# Patient Record
Sex: Male | Born: 1950 | Race: White | Hispanic: No | Smoking: Current every day smoker
Health system: Southern US, Community
[De-identification: ages and names within clinical notes are randomized; demographics above are authoritative.]

## PROBLEM LIST (undated history)

## (undated) DIAGNOSIS — I10 Essential (primary) hypertension: Secondary | ICD-10-CM

## (undated) DIAGNOSIS — J811 Chronic pulmonary edema: Secondary | ICD-10-CM

## (undated) DIAGNOSIS — J449 Chronic obstructive pulmonary disease, unspecified: Secondary | ICD-10-CM

## (undated) DIAGNOSIS — I428 Other cardiomyopathies: Secondary | ICD-10-CM

## (undated) DIAGNOSIS — E119 Type 2 diabetes mellitus without complications: Secondary | ICD-10-CM

## (undated) DIAGNOSIS — I219 Acute myocardial infarction, unspecified: Secondary | ICD-10-CM

## (undated) DIAGNOSIS — J45909 Unspecified asthma, uncomplicated: Secondary | ICD-10-CM

## (undated) DIAGNOSIS — J9690 Respiratory failure, unspecified, unspecified whether with hypoxia or hypercapnia: Secondary | ICD-10-CM

## (undated) HISTORY — PX: APPENDECTOMY: SHX54

## (undated) HISTORY — DX: Type 2 diabetes mellitus without complications: E11.9

## (undated) HISTORY — PX: BACK SURGERY: SHX140

## (undated) HISTORY — DX: Essential (primary) hypertension: I10

## (undated) HISTORY — DX: Other cardiomyopathies: I42.8

---

## 2001-05-07 ENCOUNTER — Ambulatory Visit (HOSPITAL_COMMUNITY): Admission: RE | Admit: 2001-05-07 | Discharge: 2001-05-07 | Payer: Self-pay | Admitting: Family Medicine

## 2001-05-07 ENCOUNTER — Encounter: Payer: Self-pay | Admitting: Family Medicine

## 2003-01-18 ENCOUNTER — Ambulatory Visit (HOSPITAL_COMMUNITY): Admission: RE | Admit: 2003-01-18 | Discharge: 2003-01-19 | Payer: Self-pay | Admitting: Neurosurgery

## 2003-02-14 ENCOUNTER — Encounter (HOSPITAL_COMMUNITY): Admission: RE | Admit: 2003-02-14 | Discharge: 2003-03-16 | Payer: Self-pay | Admitting: Neurosurgery

## 2003-06-03 ENCOUNTER — Emergency Department (HOSPITAL_COMMUNITY): Admission: EM | Admit: 2003-06-03 | Discharge: 2003-06-03 | Payer: Self-pay | Admitting: Family Medicine

## 2003-06-14 ENCOUNTER — Encounter: Admission: RE | Admit: 2003-06-14 | Discharge: 2003-06-14 | Payer: Self-pay | Admitting: Neurosurgery

## 2003-06-26 ENCOUNTER — Encounter: Admission: RE | Admit: 2003-06-26 | Discharge: 2003-06-26 | Payer: Self-pay | Admitting: Neurosurgery

## 2003-07-19 ENCOUNTER — Encounter: Admission: RE | Admit: 2003-07-19 | Discharge: 2003-07-19 | Payer: Self-pay | Admitting: Neurosurgery

## 2003-08-26 ENCOUNTER — Ambulatory Visit (HOSPITAL_COMMUNITY): Admission: RE | Admit: 2003-08-26 | Discharge: 2003-08-26 | Payer: Self-pay | Admitting: Emergency Medicine

## 2004-07-18 ENCOUNTER — Encounter: Admission: RE | Admit: 2004-07-18 | Discharge: 2004-07-18 | Payer: Self-pay | Admitting: Neurosurgery

## 2006-08-14 ENCOUNTER — Emergency Department (HOSPITAL_COMMUNITY): Admission: EM | Admit: 2006-08-14 | Discharge: 2006-08-14 | Payer: Self-pay | Admitting: Emergency Medicine

## 2006-11-17 ENCOUNTER — Ambulatory Visit: Payer: Self-pay | Admitting: Cardiology

## 2010-06-28 NOTE — Op Note (Signed)
NAME:  Cody [name], Cody [name]                             ACCOUNT NO.:  [id number]   MEDICAL RECORD NO.:  [id number]                   PATIENT TYPE:  OIB   LOCATION:  NA                                   FACILITY:  [facility]   PHYSICIAN:  Hewitt Shorts, M.D.            DATE OF BIRTH:  06-05-1950   DATE OF PROCEDURE:  01/18/2003  DATE OF DISCHARGE:                                 OPERATIVE REPORT   PREOPERATIVE DIAGNOSIS:  Right L2-3 foraminal disk herniation, degenerative  disk disease, spondylosis and radiculopathy.   POSTOPERATIVE DIAGNOSIS:  Right L2-3 foraminal disk herniation, degenerative  disk disease, spondylosis and radiculopathy.   OPERATION PERFORMED:  Right L2-3 extraforaminal microdiskectomy with  microdissection.   SURGEON:  Hewitt Shorts, M.D.   ANESTHESIA:  General endotracheal.   INDICATIONS FOR PROCEDURE:  The patient is a 60 year old man who presented  with an acute right lumbar radiculopathy with iliopsoas weakness.  He was  found to have a right L2-3 foraminal disk herniation.  Decision was made to  proceed with decompression via diskectomy.   DESCRIPTION OF PROCEDURE:  The patient was brought to the operating room and  placed under general endotracheal anesthesia.  The patient was turned to a  prone position.  The lumbar region was prepped with DuraPrep and draped in  sterile fashion.  The midline was infiltrated with local anesthetic with  epinephrine.  An x-ray was taken and the L2-3 level identified and a midline  incision made over the L2-3 level and carried down through the subcutaneous  tissue.  Bipolar cautery and electrocautery used to maintain hemostasis.  Dissection was carried down to the lumbar fascia which was incised to the  right side of midline and paraspinal muscles were dissected from the spinous  process and lamina in subperiosteal fashion.  Dissection was carried out  over the right L2-3 facet complex and the extraforaminal space was  carefully  exposed.  The operating microscope was draped and brought into the field to  provide additional magnification, illumination and visualization and the  remainder of the decompression and diskectomy was performed using  microdissection and microsurgical technique.  A lateral facetectomy was  performed using the [facility] Max drill as well as Kerrison punches and we  dissected in the extraforaminal space down to the right L2-3 foramen.  We  identified the right L2 nerve root and we were able to identify the disk  herniation.  We were able to mobilize it from the surrounding tissues and  removed a large free fragment of disk herniation that was compressing the  nerve root.  The nerve root relaxed and felt back into place after the disk  herniation had been removed.  We examined the foramen and extraforaminal  space for additional fragments of disk.  None were found and it was not felt  necessary to enter into the disk space.  Since the nerve  root was well  decompressed and all fragments removed.  We then irrigated the wound with  bacitracin solution, checked for hemostasis which was established with the  use of bipolar cautery as well as Gelfoam soaked in Thrombin.  All the  Gelfoam, though, was removed prior to closure.  We then instilled 2 mL of  fentanyl and 80 mg of Depo-Medrol into the extraforaminal space and then  proceeded with closure.  The deep fascia was closed with interrupted 1  undyed Vicryl sutures, the subcutaneous and subcuticular layer were closed  with interrupted inverted 2-0 undyed Vicryl sutures and the skin  edges were approximated with Dermabond.  The patient tolerated the procedure  well.  The estimated blood loss was 25 mL.  Sponge and instrument counts  were correct.  Following surgery, the patient was turned back to supine  position, reversed from anesthetic to be extubated and transferred to  recovery room for further care.                                                Hewitt Shorts, M.D.    RWN/MEDQ  D:  01/18/2003  T:  01/19/2003  Job:  [id number]

## 2010-11-26 LAB — URINALYSIS, ROUTINE W REFLEX MICROSCOPIC
Glucose, UA: 250 — AB
Ketones, ur: NEGATIVE
Leukocytes, UA: NEGATIVE
Nitrite: NEGATIVE
pH: 5.5

## 2010-11-26 LAB — URINE MICROSCOPIC-ADD ON

## 2012-01-01 ENCOUNTER — Encounter (HOSPITAL_COMMUNITY)
Admission: EM | Disposition: A | Discharge status: Home / Self Care | Payer: Self-pay | Source: Home / Self Care | Attending: Emergency Medicine

## 2012-01-01 ENCOUNTER — Encounter (HOSPITAL_COMMUNITY): Payer: Self-pay | Admitting: Anesthesiology

## 2012-01-01 ENCOUNTER — Emergency Department (HOSPITAL_COMMUNITY): Payer: Self-pay

## 2012-01-01 ENCOUNTER — Encounter (HOSPITAL_COMMUNITY): Payer: Self-pay | Admitting: *Deleted

## 2012-01-01 ENCOUNTER — Ambulatory Visit (HOSPITAL_COMMUNITY)
Admission: EM | Admit: 2012-01-01 | Discharge: 2012-01-01 | Disposition: A | Discharge status: Home / Self Care | Payer: Self-pay | Attending: Emergency Medicine | Admitting: Emergency Medicine

## 2012-01-01 ENCOUNTER — Emergency Department (HOSPITAL_COMMUNITY): Payer: Self-pay | Admitting: Anesthesiology

## 2012-01-01 DIAGNOSIS — S6991XA Unspecified injury of right wrist, hand and finger(s), initial encounter: Secondary | ICD-10-CM

## 2012-01-01 DIAGNOSIS — IMO0002 Reserved for concepts with insufficient information to code with codable children: Secondary | ICD-10-CM | POA: Insufficient documentation

## 2012-01-01 DIAGNOSIS — S61209A Unspecified open wound of unspecified finger without damage to nail, initial encounter: Secondary | ICD-10-CM | POA: Insufficient documentation

## 2012-01-01 DIAGNOSIS — F17200 Nicotine dependence, unspecified, uncomplicated: Secondary | ICD-10-CM | POA: Insufficient documentation

## 2012-01-01 DIAGNOSIS — S68119A Complete traumatic metacarpophalangeal amputation of unspecified finger, initial encounter: Secondary | ICD-10-CM

## 2012-01-01 DIAGNOSIS — E119 Type 2 diabetes mellitus without complications: Secondary | ICD-10-CM | POA: Insufficient documentation

## 2012-01-01 DIAGNOSIS — I10 Essential (primary) hypertension: Secondary | ICD-10-CM | POA: Insufficient documentation

## 2012-01-01 DIAGNOSIS — W309XXA Contact with unspecified agricultural machinery, initial encounter: Secondary | ICD-10-CM | POA: Insufficient documentation

## 2012-01-01 HISTORY — PX: AMPUTATION: SHX166

## 2012-01-01 HISTORY — PX: CLOSED REDUCTION FINGER WITH PERCUTANEOUS PINNING: SHX5612

## 2012-01-01 LAB — CBC WITH DIFFERENTIAL/PLATELET
Eosinophils Absolute: 0.1 10*3/uL (ref 0.0–0.7)
Eosinophils Relative: 1 % (ref 0–5)
Hemoglobin: 16.4 g/dL (ref 13.0–17.0)
Lymphocytes Relative: 25 % (ref 12–46)
Lymphs Abs: 2.1 10*3/uL (ref 0.7–4.0)
Neutrophils Relative %: 68 % (ref 43–77)
RBC: 4.96 MIL/uL (ref 4.22–5.81)

## 2012-01-01 LAB — BASIC METABOLIC PANEL
BUN: 13 mg/dL (ref 6–23)
CO2: 25 mEq/L (ref 19–32)
Creatinine, Ser: 0.91 mg/dL (ref 0.50–1.35)
Glucose, Bld: 189 mg/dL — ABNORMAL HIGH (ref 70–99)
Potassium: 3.6 mEq/L (ref 3.5–5.1)

## 2012-01-01 LAB — GLUCOSE, CAPILLARY: Glucose-Capillary: 225 mg/dL — ABNORMAL HIGH (ref 70–99)

## 2012-01-01 SURGERY — AMPUTATION DIGIT
Anesthesia: General | Site: Finger | Laterality: Right | Wound class: Dirty or Infected

## 2012-01-01 MED ORDER — TETANUS-DIPHTH-ACELL PERTUSSIS 5-2.5-18.5 LF-MCG/0.5 IM SUSP
0.5000 mL | Freq: Once | INTRAMUSCULAR | Status: AC
  Administered 2012-01-01: 0.5 mL via INTRAMUSCULAR
  Filled 2012-01-01: qty 0.5

## 2012-01-01 MED ORDER — OXYCODONE HCL 5 MG/5ML PO SOLN: 5.0000 mg | Freq: Once | ORAL | Status: DC | PRN

## 2012-01-01 MED ORDER — FENTANYL CITRATE 0.05 MG/ML IJ SOLN
50.0000 ug | Freq: Once | INTRAMUSCULAR | Status: AC
  Administered 2012-01-01: 50 ug via INTRAVENOUS
  Filled 2012-01-01: qty 2

## 2012-01-01 MED ORDER — 0.9 % SODIUM CHLORIDE (POUR BTL) OPTIME
TOPICAL | Status: DC | PRN
  Administered 2012-01-01: 1000 mL

## 2012-01-01 MED ORDER — OXYCODONE HCL 5 MG PO TABS: 5.0000 mg | ORAL_TABLET | Freq: Once | ORAL | Status: DC | PRN

## 2012-01-01 MED ORDER — VANCOMYCIN HCL 1000 MG IV SOLR
1000.0000 mg | INTRAVENOUS | Status: DC | PRN
  Administered 2012-01-01: 1000 mg via INTRAVENOUS

## 2012-01-01 MED ORDER — MORPHINE SULFATE 4 MG/ML IJ SOLN
4.0000 mg | Freq: Once | INTRAMUSCULAR | Status: AC
  Administered 2012-01-01: 4 mg via INTRAVENOUS
  Filled 2012-01-01: qty 1

## 2012-01-01 MED ORDER — PROMETHAZINE HCL 25 MG/ML IJ SOLN: 6.2500 mg | INTRAMUSCULAR | Status: DC | PRN

## 2012-01-01 MED ORDER — EPHEDRINE SULFATE 50 MG/ML IJ SOLN
INTRAMUSCULAR | Status: DC | PRN
  Administered 2012-01-01 (×3): 5 mg via INTRAVENOUS

## 2012-01-01 MED ORDER — HYDROMORPHONE HCL PF 1 MG/ML IJ SOLN
0.5000 mg | Freq: Once | INTRAMUSCULAR | Status: AC
  Administered 2012-01-01: 0.5 mg via INTRAVENOUS
  Filled 2012-01-01: qty 1

## 2012-01-01 MED ORDER — OXYCODONE-ACETAMINOPHEN 5-325 MG PO TABS: ORAL_TABLET | ORAL | Status: DC

## 2012-01-01 MED ORDER — LIDOCAINE HCL 1 % IJ SOLN
INTRAMUSCULAR | Status: DC | PRN
  Administered 2012-01-01: 80 mg via INTRADERMAL

## 2012-01-01 MED ORDER — ONDANSETRON HCL 4 MG/2ML IJ SOLN
INTRAMUSCULAR | Status: DC | PRN
  Administered 2012-01-01: 4 mg via INTRAVENOUS

## 2012-01-01 MED ORDER — BUPIVACAINE HCL (PF) 0.25 % IJ SOLN
INTRAMUSCULAR | Status: DC | PRN
  Administered 2012-01-01: 18 mL

## 2012-01-01 MED ORDER — MIDAZOLAM HCL 5 MG/5ML IJ SOLN
INTRAMUSCULAR | Status: DC | PRN
  Administered 2012-01-01: 2 mg via INTRAVENOUS

## 2012-01-01 MED ORDER — SULFAMETHOXAZOLE-TRIMETHOPRIM 800-160 MG PO TABS: 1.0000 | ORAL_TABLET | Freq: Two times a day (BID) | ORAL | Status: DC

## 2012-01-01 MED ORDER — LACTATED RINGERS IV SOLN
INTRAVENOUS | Status: DC | PRN
  Administered 2012-01-01 (×2): via INTRAVENOUS

## 2012-01-01 MED ORDER — VANCOMYCIN HCL IN DEXTROSE 1-5 GM/200ML-% IV SOLN
INTRAVENOUS | Status: AC
  Filled 2012-01-01: qty 200

## 2012-01-01 MED ORDER — CLINDAMYCIN PHOSPHATE 600 MG/50ML IV SOLN
600.0000 mg | Freq: Once | INTRAVENOUS | Status: AC
  Administered 2012-01-01: 600 mg via INTRAVENOUS
  Filled 2012-01-01: qty 50

## 2012-01-01 MED ORDER — HYDROMORPHONE HCL PF 1 MG/ML IJ SOLN: 0.2500 mg | INTRAMUSCULAR | Status: DC | PRN

## 2012-01-01 MED ORDER — FENTANYL CITRATE 0.05 MG/ML IJ SOLN
INTRAMUSCULAR | Status: DC | PRN
  Administered 2012-01-01: 50 ug via INTRAVENOUS

## 2012-01-01 MED ORDER — BUPIVACAINE HCL (PF) 0.25 % IJ SOLN
INTRAMUSCULAR | Status: AC
  Filled 2012-01-01: qty 30

## 2012-01-01 MED ORDER — PROPOFOL 10 MG/ML IV BOLUS
INTRAVENOUS | Status: DC | PRN
  Administered 2012-01-01: 160 mg via INTRAVENOUS

## 2012-01-01 SURGICAL SUPPLY — 49 items
BANDAGE ELASTIC 3 VELCRO ST LF (GAUZE/BANDAGES/DRESSINGS) ×1 IMPLANT
BANDAGE GAUZE ELAST BULKY 4 IN (GAUZE/BANDAGES/DRESSINGS) ×1 IMPLANT
BLADE LONG MED 31X9 (MISCELLANEOUS) ×2 IMPLANT
BNDG CMPR 9X4 STRL LF SNTH (GAUZE/BANDAGES/DRESSINGS) ×1
BNDG COHESIVE 3X5 TAN STRL LF (GAUZE/BANDAGES/DRESSINGS) IMPLANT
BNDG ESMARK 4X9 LF (GAUZE/BANDAGES/DRESSINGS) ×2 IMPLANT
CAP PIN PROTECTOR ORTHO WHT (CAP) ×1 IMPLANT
CLOTH BEACON ORANGE TIMEOUT ST (SAFETY) ×2 IMPLANT
CORDS BIPOLAR (ELECTRODE) ×2 IMPLANT
CUFF TOURNIQUET SINGLE 18IN (TOURNIQUET CUFF) ×2 IMPLANT
CUFF TOURNIQUET SINGLE 24IN (TOURNIQUET CUFF) IMPLANT
DRAPE OEC MINIVIEW 54X84 (DRAPES) ×1 IMPLANT
DRSG KUZMA FLUFF (GAUZE/BANDAGES/DRESSINGS) IMPLANT
DURAPREP 26ML APPLICATOR (WOUND CARE) ×1 IMPLANT
GAUZE SPONGE 4X4 16PLY XRAY LF (GAUZE/BANDAGES/DRESSINGS) ×1 IMPLANT
GAUZE XEROFORM 1X8 LF (GAUZE/BANDAGES/DRESSINGS) ×2 IMPLANT
GAUZE XEROFORM 5X9 LF (GAUZE/BANDAGES/DRESSINGS) ×1 IMPLANT
GLOVE BIO SURGEON STRL SZ 6.5 (GLOVE) ×1 IMPLANT
GLOVE BIO SURGEON STRL SZ8.5 (GLOVE) ×1 IMPLANT
GLOVE SURG ORTHO 8.0 STRL STRW (GLOVE) ×2 IMPLANT
GOWN PREVENTION PLUS XLARGE (GOWN DISPOSABLE) ×3 IMPLANT
GOWN STRL NON-REIN LRG LVL3 (GOWN DISPOSABLE) ×4 IMPLANT
GOWN STRL REIN XL XLG (GOWN DISPOSABLE) ×2 IMPLANT
KIT BASIN OR (CUSTOM PROCEDURE TRAY) ×2 IMPLANT
KIT ROOM TURNOVER OR (KITS) ×2 IMPLANT
KWIRE 4.0 X .035IN (WIRE) ×1 IMPLANT
MANIFOLD NEPTUNE II (INSTRUMENTS) ×1 IMPLANT
NDL HYPO 25GX1X1/2 BEV (NEEDLE) IMPLANT
NEEDLE HYPO 25GX1X1/2 BEV (NEEDLE) ×2 IMPLANT
NS IRRIG 1000ML POUR BTL (IV SOLUTION) ×2 IMPLANT
PACK ORTHO EXTREMITY (CUSTOM PROCEDURE TRAY) ×2 IMPLANT
PAD ARMBOARD 7.5X6 YLW CONV (MISCELLANEOUS) ×4 IMPLANT
PAD CAST 4YDX4 CTTN HI CHSV (CAST SUPPLIES) IMPLANT
PADDING CAST ABS 4INX4YD NS (CAST SUPPLIES) ×1
PADDING CAST ABS COTTON 4X4 ST (CAST SUPPLIES) IMPLANT
PADDING CAST COTTON 4X4 STRL (CAST SUPPLIES)
RUBBERBAND STERILE (MISCELLANEOUS) ×1 IMPLANT
SPECIMEN JAR SMALL (MISCELLANEOUS) ×1 IMPLANT
SPONGE GAUZE 4X4 12PLY (GAUZE/BANDAGES/DRESSINGS) ×1 IMPLANT
SPONGE SCRUB IODOPHOR (GAUZE/BANDAGES/DRESSINGS) ×2 IMPLANT
SUT ETHILON 5 0 PS 2 18 (SUTURE) IMPLANT
SUT MON AB 5-0 P3 18 (SUTURE) ×4 IMPLANT
SUT SILK 4 0 PS 2 (SUTURE) IMPLANT
SUT VICRYL 4-0 PS2 18IN ABS (SUTURE) IMPLANT
SYR CONTROL 10ML LL (SYRINGE) ×1 IMPLANT
TOWEL OR 17X24 6PK STRL BLUE (TOWEL DISPOSABLE) ×2 IMPLANT
TOWEL OR 17X26 10 PK STRL BLUE (TOWEL DISPOSABLE) ×2 IMPLANT
UNDERPAD 30X30 INCONTINENT (UNDERPADS AND DIAPERS) ×2 IMPLANT
WATER STERILE IRR 1000ML POUR (IV SOLUTION) ×1 IMPLANT

## 2012-01-01 NOTE — Op Note (Signed)
Dictation [id number]

## 2012-01-01 NOTE — Transfer of Care (Signed)
Immediate Anesthesia Transfer of Care Note  Patient: Cody [name]  Procedure(s) Performed: Procedure(s) (LRB) with comments: AMPUTATION DIGIT (Right) - Right Ring and small finger revision of amputation, I&D and pinning CLOSED REDUCTION FINGER WITH PERCUTANEOUS PINNING (Right)  Patient Location: PACU  Anesthesia Type:General  Level of Consciousness: awake, alert  and oriented  Airway & Oxygen Therapy: Patient Spontanous Breathing and Patient connected to nasal cannula oxygen  Post-op Assessment: Report given to PACU RN and Post -op Vital signs reviewed and stable  Post vital signs: Reviewed and stable  Complications: No apparent anesthesia complications

## 2012-01-01 NOTE — ED Notes (Signed)
[facility] has no truck available at this time, so [facility] called for transport.  Nurse informed.

## 2012-01-01 NOTE — ED Notes (Signed)
Pt had fingers of right hand amputated while working on pulley of combine, severe pain, another family member to bring fingers within glove soon

## 2012-01-01 NOTE — H&P (Signed)
Cody [name] is an 61 y.o. male.   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  Amputation of ring and small.  Lacerations to index and long.  Seen at [facility] and transferred for care.  Reports no previous injury to right hand.  Some abrasions of right arm and abdomen.  No other injuries.    Past Medical History  Diagnosis Date  . Diabetes mellitus without complication   . Hypertension     Past Surgical History  Procedure Date  . Back surgery   . Appendectomy     History reviewed. No pertinent family history. Social History:  reports that he has been smoking Cigarettes.  He has been smoking about 1.5 packs per day. He does not have any smokeless tobacco history on file. He reports that he does not drink alcohol or use illicit drugs.  Allergies:  Allergies  Allergen Reactions  . Penicillins      (Not in a hospital admission)  Results for orders placed during the hospital encounter of 01/01/12 (from the past 48 hour(s))  CBC WITH DIFFERENTIAL     Status: Normal   Collection Time   01/01/12  2:49 PM      Component Value Range Comment   WBC 8.5  4.0 - 10.5 K/uL    RBC 4.96  4.22 - 5.81 MIL/uL    Hemoglobin 16.4  13.0 - 17.0 g/dL    HCT 40.9  81.1 - 91.4 %    MCV 92.1  78.0 - 100.0 fL    MCH 33.1  26.0 - 34.0 pg    MCHC 35.9  30.0 - 36.0 g/dL    RDW 78.2  95.6 - 21.3 %    Platelets 257  150 - 400 K/uL    Neutrophils Relative 68  43 - 77 %    Neutro Abs 5.7  1.7 - 7.7 K/uL    Lymphocytes Relative 25  12 - 46 %    Lymphs Abs 2.1  0.7 - 4.0 K/uL    Monocytes Relative 6  3 - 12 %    Monocytes Absolute 0.5  0.1 - 1.0 K/uL    Eosinophils Relative 1  0 - 5 %    Eosinophils Absolute 0.1  0.0 - 0.7 K/uL    Basophils Relative 0  0 - 1 %    Basophils Absolute 0.0  0.0 - 0.1 K/uL   BASIC METABOLIC PANEL     Status: Abnormal   Collection Time   01/01/12  2:49  PM      Component Value Range Comment   Sodium 131 (*) 135 - 145 mEq/L    Potassium 3.6  3.5 - 5.1 mEq/L    Chloride 95 (*) 96 - 112 mEq/L    CO2 25  19 - 32 mEq/L    Glucose, Bld 189 (*) 70 - 99 mg/dL    BUN 13  6 - 23 mg/dL    Creatinine, Ser 0.86  0.50 - 1.35 mg/dL    Calcium 9.5  8.4 - 57.8 mg/dL    GFR calc non Af Amer >90  >90 mL/min    GFR calc Af Amer >90  >90 mL/min     Dg Hand 2 View Right  01/01/2012  *RADIOLOGY REPORT*  Clinical Data: Hand caught in a combine machine. Traumatic amputation.  RIGHT HAND - 2 VIEW  Comparison: None.  Findings: There is been amputation of the ring finger at the level of the distal interphalangeal joint.  There is been amputation of the small finger at the level of the middle phalanx, with an irregular oblique fracture of the middle phalanx and the digit distal to this point absent.  There is also a transverse fracture of the proximal diaphysis of the proximal phalanx of the small finger demonstrating 5 mm of anterior displacement of the distal fracture fragment, 4 mm of overlap, and 37 degrees of apex anterior angulation.  Bandaging noted along the amputation sites.  I cannot exclude a remote distal boxer's fracture of the fifth metacarpal based on the lateral projection, but no acute metacarpal fracture is observed on this two views series.  IMPRESSION:  1.  Traumatic amputation of the ring finger at the level of the distal interphalangeal joint and of the small finger at the level of the middle phalanx. 2.  Moderately displaced, angulated, and overlapped fracture of the proximal diaphysis of the proximal phalanx of the small finger.   Original Report Authenticated By: Gaylyn Rong, M.D.      A comprehensive review of systems was negative.  Blood pressure 150/89, pulse 89, temperature 98 F (36.7 C), temperature source Oral, resp. rate 15, height 5\' 8"  (1.727 m), weight 81.647 kg (180 lb), SpO2 97.00%.  General appearance: alert, cooperative and  appears stated age Head: Normocephalic, without obvious abnormality, atraumatic Neck: supple, symmetrical, trachea midline Resp: clear to auscultation bilaterally Cardio: regular rate and rhythm GI: non tender Extremities: light touch sensation and capillary refill intact all digits.  +epl/fpl/io.  amputation of small through middle phalanx.  amputation ring at dip.  laceration of volar index.  loss of pad tissue of long.  tendon visible in wound.  intact fdp both index and long. Pulses: 2+ and symmetric Skin: as above Neurologic: Grossly normal Incision/Wound: As above  Assessment/Plan Right hand index, long, ring, and small finger injuries.  Recommend OR for I&D of wounds with pinning of small proximal phalanx, revision amputation small and ring; repair laceration index and repair vs grafting long finger wound.  Risks, benefits, and alternatives of surgery were discussed and the patient agrees with the plan of care.   Kitty Cadavid R 01/01/2012, 6:05 PM

## 2012-01-01 NOTE — ED Notes (Signed)
Dr Berton Mount at bedside, right hand unwrapped and wound cleansed by BD RN. Portable hand xray obtained. Pt tolerated well. Pt has 2 digits amputated, Rt 5 th and 4 th digits.  Dr Berton Mount cleaned digits and both were placed on ice. Pt also has multiple superficial abrasions and lacerations to Rt forearm. Rt lower lip/chin also noted to have puncture wound through to teeth, abrasion noted to rt axilla, no edema noted and small amt light bruising noted. All bleeding controlled at this time. All wounds cleansed and wrapped with cling. Pt states pain level is 5/10. Fentanyl to be given per orders. Pt is alert and oriented x 4 . Denies other injuries at this time. Family at bedside, verbalized understanding to plan of care. Marland Kitchen

## 2012-01-01 NOTE — ED Notes (Signed)
This rn asked secty to [place] dr Rica Mast.

## 2012-01-01 NOTE — ED Notes (Signed)
Melissa Consulting civil engineer given report on said pt at time of transport

## 2012-01-01 NOTE — ED Notes (Signed)
Last drank within last hour, last ate yesterday

## 2012-01-01 NOTE — ED Provider Notes (Signed)
History     CSN: [id number]  Arrival date & time 01/01/12  1431   First MD Initiated Contact with Patient 01/01/12 1440      Chief Complaint  Patient presents with  . Hand Injury     Patient is a 61 y.o. male presenting with hand injury. The history is provided by the patient.  Hand Injury  The incident occurred less than 1 hour ago. The incident occurred at work. Injury mechanism: right hand caught in combine. Pain location: right hand. The pain is moderate. The pain has been constant since the incident. Pertinent negatives include no fever. The symptoms are aggravated by movement, palpation and use. He has tried rest for the symptoms. The treatment provided mild relief.  Pt reports he got his right hand caught in a combine at work.  He reports that some of his fingers were injured and amputated.  He reports the combine actually pulled his entire arm forward but his arm was not crushed.  No head injury.  No LOC.  No cp/sob.  No abdominal pain.  He is not on anticoagulants.  No other complaints are reported.  Past Medical History  Diagnosis Date  . Diabetes mellitus without complication   . Hypertension     Past Surgical History  Procedure Date  . Back surgery   . Appendectomy     History reviewed. No pertinent family history.  History  Substance Use Topics  . Smoking status: Current Every Day Smoker -- 1.5 packs/day    Types: Cigarettes  . Smokeless tobacco: Not on file  . Alcohol Use: No      Review of Systems  Constitutional: Negative for fever.  Respiratory: Negative for shortness of breath.   Cardiovascular: Negative for chest pain.  Gastrointestinal: Negative for abdominal pain.  Musculoskeletal:       Hand pain   Skin: Positive for wound.  Neurological: Negative for weakness.  Psychiatric/Behavioral: The patient is not nervous/anxious.   All other systems reviewed and are negative.    Allergies  Penicillins  Home Medications  No current outpatient  prescriptions on file.  BP 156/97  Pulse 106  Temp 97.8 F (36.6 C) (Oral)  Resp 20  Ht 5\' 8"  (1.727 m)  Wt 180 lb (81.647 kg)  BMI 27.37 kg/m2  SpO2 96%  Physical Exam CONSTITUTIONAL: Well developed/well nourished HEAD AND FACE: Normocephalic/atraumatic EYES: EOMI/PERRL ENMT: Mucous membranes moist NECK: supple no meningeal signs SPINE:entire spine nontender CV: S1/S2 noted, no murmurs/rubs/gallops noted LUNGS: Lungs are clear to auscultation bilaterally, no apparent distress Chest - no bruising, crepitance is noted.   ABDOMEN: soft, nontender, no rebound or guarding, no bruising is noted NEURO: Pt is awake/alert, moves all extremitiesx4 EXTREMITIES: pulses normal, full ROM. He has lacerations to distal tips of right index and right middle finger, bleeding controlled and he can move these fingers.  He has amputation noted to right middle (at approximately DIP) and right little finger (at approximiately PIP).  Bleeding controlled.  There is bone exposed on these injuries.  The right thumb is nontender with full ROM He has scattered abrasions to right forearm/arm but no bruising.  He has full ROM of right elbow and shoulder SKIN: warm, color normal PSYCH: no abnormalities of mood noted   ED Course  Procedures    Labs Reviewed  CBC WITH DIFFERENTIAL  BASIC METABOLIC PANEL   8:65 PM I spoke to on call nurse for dr Betha Loa, aware of amputation, will call back 3:07 PM  Request I transfer to [place] ED for dr Merlyn Lot to evaluate Clindamycin given (PCN allergy) and tetanus Pt has no other traumatic injury noted His fingers were found in a glove.  I personally rinsed them with water and were placed in a bag of ice.  I informed patient/family that they may not be salvageable.  They understand this. Wounds were cleansed and bandaged by nursing Will call ED physician at [facility] to given report  MDM  Nursing notes including past medical history and social history reviewed and  considered in documentation xrays reviewed and considered         Joya Gaskins, MD 01/01/12 1524

## 2012-01-01 NOTE — Preoperative (Signed)
Beta Blockers   Reason not to administer Beta Blockers:Not Applicable 

## 2012-01-01 NOTE — Anesthesia Preprocedure Evaluation (Signed)
Anesthesia Evaluation  Patient identified by MRN, date of birth, ID band Patient awake    Reviewed: Allergy & Precautions, H&P , NPO status , Patient's Chart, lab work & pertinent test results  Airway Mallampati: I TM Distance: >3 FB Neck ROM: Full    Dental   Pulmonary COPDCurrent Smoker,  + rhonchi         Cardiovascular hypertension, Rhythm:Regular Rate:Normal     Neuro/Psych    GI/Hepatic   Endo/Other  diabetes  Renal/GU      Musculoskeletal   Abdominal   Peds  Hematology   Anesthesia Other Findings   Reproductive/Obstetrics                           Anesthesia Physical Anesthesia Plan  ASA: III and emergent  Anesthesia Plan: General   Post-op Pain Management:    Induction: Intravenous  Airway Management Planned: LMA  Additional Equipment:   Intra-op Plan:   Post-operative Plan: Extubation in OR  Informed Consent: I have reviewed the patients History and Physical, chart, labs and discussed the procedure including the risks, benefits and alternatives for the proposed anesthesia with the patient or authorized representative who has indicated his/her understanding and acceptance.     Plan Discussed with: CRNA and Surgeon  Anesthesia Plan Comments:         Anesthesia Quick Evaluation

## 2012-01-01 NOTE — ED Provider Notes (Signed)
Patient transferred from [facility] to see Betha Loa, MD in the evaluation of right hand injury involving traumatic amputation.  MSE initiated.  Patient awake, alert, oriented.  Lungs CTA bilaterally.  S1/S2, RRR.  Wounds loosely covered with bandages.  Distal sensation and movement present in right thumb, index, and third fingers.  Patient reporting return of hand pain--additional analgesia ordered.  Dr. Merlyn Lot paged to notify of patients arrival in CDU.  1730:  Patient has been seen and evaluated by Dr. Verlon Setting is for surgery around 2000 this evening.  Jimmye Norman, NP 01/01/12 1943

## 2012-01-01 NOTE — Anesthesia Procedure Notes (Signed)
Procedure Name: LMA Insertion Date/Time: 01/01/2012 6:26 PM Performed by: Angelica Pou Pre-anesthesia Checklist: Patient identified, Timeout performed, Emergency Drugs available, Suction available and Patient being monitored Patient Re-evaluated:Patient Re-evaluated prior to inductionOxygen Delivery Method: Circle system utilized Preoxygenation: Pre-oxygenation with 100% oxygen Intubation Type: IV induction LMA: LMA inserted LMA Size: 4.0 Number of attempts: 1 Placement Confirmation: breath sounds checked- equal and bilateral and positive ETCO2 Tube secured with: Tape Dental Injury: Teeth and Oropharynx as per pre-operative assessment  Comments: LMA supreme #4

## 2012-01-02 ENCOUNTER — Encounter (HOSPITAL_COMMUNITY): Payer: Self-pay | Admitting: Orthopedic Surgery

## 2012-01-02 NOTE — ED Provider Notes (Signed)
Medical screening examination/treatment/procedure(s) were performed by non-physician practitioner and as supervising physician I was immediately available for consultation/collaboration.   Lyanne Co, MD 01/02/12 [phone]

## 2012-01-02 NOTE — Op Note (Signed)
NAME:  Cody [name], Cody [name]                   ACCOUNT NO.:  [id number]  MEDICAL RECORD NO.:  [id number]  LOCATION:  [facility]                         FACILITY:  [facility]  PHYSICIAN:  Betha Loa, MD        DATE OF BIRTH:  12-Jul-1950  DATE OF PROCEDURE:  01/01/2012 DATE OF DISCHARGE:  01/01/2012                              OPERATIVE REPORT   PREOPERATIVE DIAGNOSES: 1. Right ring and small finger amputations. 2. Index and long finger soft tissue injuries with small finger     proximal phalanx fractures.  POSTOPERATIVE DIAGNOSES: 1. Right ring finger amputations 2. Right small finger amputation 3. Right index and long finger soft tissue injuries  4. Right small finger proximal phalanx fracture  PROCEDURES: 1. Irrigation and debridement of lacerations and open fractures. 2. Revision amputation right ring finger 3. Revision amputation right small finger 4. Open reduction and pinning of small finger proximal phalanx fracture 5. Repair of soft tissue lacerations, index and long finger.  SURGEON:  Betha Loa, MD  ASSISTANT:  Artist Pais. Mina Marble, MD  ANESTHESIA:  General.  IV FLUIDS:  Per Anesthesia flow sheet.  ESTIMATED BLOOD LOSS:  Minimal.  COMPLICATIONS:  None.  SPECIMENS:  None.  TOURNIQUET:  None.  DISPOSITION:  Stable to PACU.  INDICATIONS:  Mr. 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  He was seen at [facility] Emergency Department where he was found to have amputation of the ring and small fingers.  I was consulted for management of the injury.  He was transferred to the [facility] for my care.  On evaluation, he had complete amputation of the ring and small fingers at the level of the proximal aspect of the middle phalanx in the small finger and through the DIP joint of the ring finger.  He had soft tissue lacerations in the index and long finger with some loss of tissue of the  volar long finger.  I recommended to Mr. Homann going to the operating room for irrigation and debridement of the wounds, revision amputations, pinning of the small finger fracture, and treatment of the soft tissue injuries of the index and long finger. Risks, benefits, and alternatives of surgery were discussed including risk of blood loss, infection, damage to nerves, vessels, tendons, ligaments, bone, failure of surgery, need for additional surgery, complications with wound healing, continued pain, nonunion, malunion, stiffness, and failure of repair.  He voiced understanding of these risks and elected to proceed.  He had been given clindamycin in the emergency department at [facility] and he was given vancomycin preoperatively here.  OPERATIVE COURSE:  After being identified preoperatively by myself, the patient and I agreed upon the procedure and site of the procedure. Surgical site was marked.  The risks, benefits, and alternatives of surgery were reviewed and he wished to proceed.  Surgical consent had been signed.  He was given vancomycin as preoperative antibiotic coverage, this is due to penicillin allergy.  He was transported to the operating room and placed on the operating room table in supine position with  the right upper extremity on arm board.  General anesthesia was induced by the anesthesiologist.  Right upper extremity was prepped and draped in normal sterile orthopedic fashion.  Surgical pause was performed between surgeons, anesthesia, operating staff, and all were in agreement as to the patient, procedure, and site of procedure.  The tourniquet was at the proximal aspect of the extremity, but never inflated.  The wounds were inspected.  There was a small amount of gross contamination which was debrided.  All wounds were copiously irrigated with 1000 mL of sterile saline.  The wounds were all clean after debridement and irrigation.  Revision amputation was performed of  the small finger through the PIP joint.  Revision amputation was performed of the ring finger.  The condyles of the middle phalanx were rongeured back, so that the skin flap would be able to be brought over the end of the bone.  A 5-0 Monocryl suture was used to repair the skin edges. This was able to be done without tension.  The wound on the volar aspect of the long finger was able to be repaired again using 5-0 Monocryl suture.  There was a distally-based flap of skin that was able to be swung down to cover the exposed tendon which was abraded and slightly frayed, but intact.  The radial portion of the wound was left to granulate in.  There was good coverage of tendon and bone.  The laceration to the index finger was into the subcutaneous tissues.  This again repaired using the 5-0 Monocryl suture.  Attention was turned to the small finger proximal phalanx fracture.  Two 0.035-inch K-wires were advanced from the distal aspect of the bone through the condyles and across the fracture site into the proximal aspect of the proximal phalanx.  This was adequate to stabilize the fracture.  C-arm was used in AP, lateral, and oblique projections throughout the case to ensure acceptable reduction and position of hardware which was the case.  There was ulnar displacement of the distal fragment on the AP view and good alignment on the lateral view.  There was bony apposition.  It was felt that since this was simply going to be a post, this would be acceptable reduction.  The pins were placed through the distal skin flap and the skin flap was repaired with 5-0 Monocryl suture.  This opposed skin edges well.  Digital block was performed with 18 mL total of 0.25% plain Marcaine to aid in postoperative analgesia.  The wounds were then dressed with sterile Xeroform, 4x4s, and wrapped with Kerlix.  A volar splint was placed and wrapped with Kerlix and Ace bandage.  The operative drapes were broken down  and the patient was awoken from anesthesia safely.  He was transferred back to the stretcher and taken to the PACU in stable condition.  I will see him in the office in 1 week for postoperative followup.  I will give him Percocet 5/325, 1-2 p.o. q.6 hours p.r.n. pain, dispensed #50, and Bactrim DS 1 p.o. b.i.d. x7 days.     Betha Loa, MD     KK/MEDQ  D:  01/01/2012  T:  01/02/2012  Job:  [id number]

## 2012-01-02 NOTE — Anesthesia Postprocedure Evaluation (Signed)
  Anesthesia Post-op Note  Patient: Cody [name]  Procedure(s) Performed: Procedure(s) (LRB) with comments: AMPUTATION DIGIT (Right) - Right Ring and small finger revision of amputation, I&D and pinning CLOSED REDUCTION FINGER WITH PERCUTANEOUS PINNING (Right)  Patient Location: PACU  Anesthesia Type:General  Level of Consciousness: awake and alert   Airway and Oxygen Therapy: Patient Spontanous Breathing  Post-op Pain: mild  Post-op Assessment: Post-op Vital signs reviewed, Patient's Cardiovascular Status Stable, Respiratory Function Stable, Patent Airway, No signs of Nausea or vomiting and Pain level controlled  Post-op Vital Signs: stable  Complications: No apparent anesthesia complications

## 2012-09-27 ENCOUNTER — Ambulatory Visit (HOSPITAL_COMMUNITY)
Admission: RE | Admit: 2012-09-27 | Discharge: 2012-09-27 | Disposition: A | Discharge status: Home / Self Care | Payer: Self-pay | Source: Ambulatory Visit | Attending: Family Medicine | Admitting: Family Medicine

## 2012-09-27 ENCOUNTER — Other Ambulatory Visit (HOSPITAL_COMMUNITY): Payer: Self-pay | Admitting: Family Medicine

## 2012-09-27 ENCOUNTER — Other Ambulatory Visit (HOSPITAL_COMMUNITY): Payer: Self-pay | Admitting: *Deleted

## 2012-09-27 DIAGNOSIS — M545 Low back pain, unspecified: Secondary | ICD-10-CM | POA: Insufficient documentation

## 2012-10-06 ENCOUNTER — Emergency Department (HOSPITAL_COMMUNITY)
Admission: EM | Admit: 2012-10-06 | Discharge: 2012-10-06 | Disposition: A | Discharge status: Home / Self Care | Payer: BC Managed Care – PPO | Attending: Emergency Medicine | Admitting: Emergency Medicine

## 2012-10-06 ENCOUNTER — Encounter (HOSPITAL_COMMUNITY): Payer: Self-pay | Admitting: *Deleted

## 2012-10-06 ENCOUNTER — Emergency Department (HOSPITAL_COMMUNITY): Payer: BC Managed Care – PPO

## 2012-10-06 DIAGNOSIS — Z880 Allergy status to penicillin: Secondary | ICD-10-CM | POA: Insufficient documentation

## 2012-10-06 DIAGNOSIS — F17200 Nicotine dependence, unspecified, uncomplicated: Secondary | ICD-10-CM | POA: Insufficient documentation

## 2012-10-06 DIAGNOSIS — Z79899 Other long term (current) drug therapy: Secondary | ICD-10-CM | POA: Insufficient documentation

## 2012-10-06 DIAGNOSIS — Y9269 Other specified industrial and construction area as the place of occurrence of the external cause: Secondary | ICD-10-CM | POA: Insufficient documentation

## 2012-10-06 DIAGNOSIS — Y9389 Activity, other specified: Secondary | ICD-10-CM | POA: Insufficient documentation

## 2012-10-06 DIAGNOSIS — S81801A Unspecified open wound, right lower leg, initial encounter: Secondary | ICD-10-CM

## 2012-10-06 DIAGNOSIS — I10 Essential (primary) hypertension: Secondary | ICD-10-CM | POA: Insufficient documentation

## 2012-10-06 DIAGNOSIS — W319XXA Contact with unspecified machinery, initial encounter: Secondary | ICD-10-CM | POA: Insufficient documentation

## 2012-10-06 DIAGNOSIS — Z7982 Long term (current) use of aspirin: Secondary | ICD-10-CM | POA: Insufficient documentation

## 2012-10-06 DIAGNOSIS — S81009A Unspecified open wound, unspecified knee, initial encounter: Secondary | ICD-10-CM | POA: Insufficient documentation

## 2012-10-06 DIAGNOSIS — Y990 Civilian activity done for income or pay: Secondary | ICD-10-CM | POA: Insufficient documentation

## 2012-10-06 DIAGNOSIS — E1169 Type 2 diabetes mellitus with other specified complication: Secondary | ICD-10-CM | POA: Insufficient documentation

## 2012-10-06 MED ORDER — DOXYCYCLINE HYCLATE 100 MG PO CAPS: 100.0000 mg | ORAL_CAPSULE | Freq: Two times a day (BID) | ORAL | Status: DC

## 2012-10-06 NOTE — ED Provider Notes (Signed)
CSN: [id number]     Arrival date & time 10/06/12  1540 History   First MD Initiated Contact with Patient 10/06/12 1653     Chief Complaint  Patient presents with  . 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 He sustained wounds to medial R leg and lateral R ankle. Minimal bleeding. Pain well controlled.   Past Medical History  Diagnosis Date  . Diabetes mellitus without complication   . Hypertension    Past Surgical History  Procedure Laterality Date  . Back surgery    . Appendectomy    . Amputation  01/01/2012    Procedure: AMPUTATION DIGIT;  Surgeon: Tami Ribas, MD;  Location: [facility] OR;  Service: Orthopedics;  Laterality: Right;  Right Ring and small finger revision of amputation, I&D and pinning  . Closed reduction finger with percutaneous pinning  01/01/2012    Procedure: CLOSED REDUCTION FINGER WITH PERCUTANEOUS PINNING;  Surgeon: Tami Ribas, MD;  Location: [facility] OR;  Service: Orthopedics;  Laterality: Right;   No family history on file. History  Substance Use Topics  . Smoking status: Current Every Day Smoker -- 1.50 packs/day    Types: Cigarettes  . Smokeless tobacco: Not on file  . Alcohol Use: No    Review of Systems All other systems reviewed and are negative except as noted in HPI.   Allergies  Penicillins  Home Medications   Current Outpatient Rx  Name  Route  Sig  Dispense  Refill  . ALPRAZolam (XANAX) 1 MG tablet   Oral   Take 1 mg by mouth 3 (three) times daily.         Marland Kitchen aspirin EC 81 MG tablet   Oral   Take 81 mg by mouth every morning.         . benazepril (LOTENSIN) 20 MG tablet   Oral   Take 20 mg by mouth every morning.         Marland Kitchen FLUoxetine (PROZAC) 20 MG capsule   Oral   Take 20 mg by mouth every morning.         Marland Kitchen glipiZIDE (GLUCOTROL XL) 10 MG 24 hr tablet   Oral  Take 10 mg by mouth every morning.         Marland Kitchen oxyCODONE-acetaminophen (PERCOCET) 5-325 MG per tablet      1-2 tabs po q6 hours prn pain   50 tablet   0   . sulfamethoxazole-trimethoprim (BACTRIM DS) 800-160 MG per tablet   Oral   Take 1 tablet by mouth 2 (two) times daily.   14 tablet   0    BP 153/84  Pulse 113  Temp(Src) 97.5 F (36.4 C) (Oral)  Resp 24  Ht 5\' 8"  (1.727 m)  Wt 180 lb (81.647 kg)  BMI 27.38 kg/m2  SpO2 100% Physical Exam  Nursing note and vitals reviewed. Constitutional: He is oriented to person, place, and time. He appears well-developed and well-nourished.  HENT:  Head: Normocephalic and atraumatic.  Eyes: EOM are normal. Pupils are equal, round, and reactive to light.  Neck: Normal range of motion. Neck supple.  Cardiovascular: Normal rate, normal heart sounds and intact distal pulses.   Pulmonary/Chest: Effort normal and breath sounds normal.  Abdominal: Bowel sounds are normal. He exhibits no distension. There is no tenderness.  Musculoskeletal: Normal range of motion. He exhibits tenderness. He exhibits  no edema.  Neurological: He is alert and oriented to person, place, and time. He has normal strength. No cranial nerve deficit or sensory deficit.  Skin: Skin is warm and dry. No rash noted.  1cm x 3cm wound on R medial lower leg with full thickness tissue avulsion and muscle visible; there is also a superficial abrasion to R lateral ankle  Psychiatric: He has a normal mood and affect.    ED Course  Procedures (including critical care time) Labs Review Labs Reviewed - No data to display Imaging Review Dg Tibia/fibula Right  10/06/2012   *RADIOLOGY REPORT*  Clinical Data: Puncture wound to the right lower leg.  RIGHT TIBIA AND FIBULA - 2 VIEW  Comparison: None.  Findings: No acute fracture or dislocation is identified.  There is some soft tissue gas in the medial aspect of the right lower leg. A healed fracture is noted involving the fibular  diaphysis.  No soft tissue foreign body is seen.  IMPRESSION: No evidence of acute fracture.  Some soft tissue gas is seen in the medial aspect of the distal right calf.   Original Report Authenticated By: Irish Lack, M.D.    MDM   1. Avulsion of skin of lower leg, right, initial encounter     Pt with open wound not amenable to primary repair will need to heal by secondary intention. Nursing was asked to irrigate wound with saline and apply a nonadehsive dressing. Will Rx Abx, tetanus is UTD. Close PCP followup for long term wound care in diabetic patient.     Charles B. Bernette Mayers, MD 10/06/12 [phone]

## 2012-10-06 NOTE — ED Notes (Signed)
Puncture wound on right lower leg cleaned with normal saline, dressed with Vaseline gauze and wrapped with curlex per verbal order from EDP. Pt tolerated well.

## 2012-10-06 NOTE — ED Notes (Signed)
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

## 2013-07-30 ENCOUNTER — Encounter (HOSPITAL_COMMUNITY): Payer: Self-pay | Admitting: Emergency Medicine

## 2013-07-30 ENCOUNTER — Emergency Department (HOSPITAL_COMMUNITY)
Admission: EM | Admit: 2013-07-30 | Discharge: 2013-07-30 | Disposition: A | Discharge status: Home / Self Care | Payer: Self-pay | Attending: Emergency Medicine | Admitting: Emergency Medicine

## 2013-07-30 DIAGNOSIS — J029 Acute pharyngitis, unspecified: Secondary | ICD-10-CM | POA: Insufficient documentation

## 2013-07-30 DIAGNOSIS — H9209 Otalgia, unspecified ear: Secondary | ICD-10-CM | POA: Insufficient documentation

## 2013-07-30 DIAGNOSIS — Z880 Allergy status to penicillin: Secondary | ICD-10-CM | POA: Insufficient documentation

## 2013-07-30 DIAGNOSIS — F17200 Nicotine dependence, unspecified, uncomplicated: Secondary | ICD-10-CM | POA: Insufficient documentation

## 2013-07-30 DIAGNOSIS — E119 Type 2 diabetes mellitus without complications: Secondary | ICD-10-CM | POA: Insufficient documentation

## 2013-07-30 DIAGNOSIS — H9202 Otalgia, left ear: Secondary | ICD-10-CM

## 2013-07-30 DIAGNOSIS — I10 Essential (primary) hypertension: Secondary | ICD-10-CM | POA: Insufficient documentation

## 2013-07-30 DIAGNOSIS — Z7982 Long term (current) use of aspirin: Secondary | ICD-10-CM | POA: Insufficient documentation

## 2013-07-30 DIAGNOSIS — R51 Headache: Secondary | ICD-10-CM | POA: Insufficient documentation

## 2013-07-30 DIAGNOSIS — Z79899 Other long term (current) drug therapy: Secondary | ICD-10-CM | POA: Insufficient documentation

## 2013-07-30 DIAGNOSIS — J069 Acute upper respiratory infection, unspecified: Secondary | ICD-10-CM | POA: Insufficient documentation

## 2013-07-30 MED ORDER — HYDROCODONE-ACETAMINOPHEN 5-325 MG PO TABS: 1.0000 | ORAL_TABLET | ORAL | Status: DC | PRN

## 2013-07-30 MED ORDER — FEXOFENADINE-PSEUDOEPHED ER 60-120 MG PO TB12: 1.0000 | ORAL_TABLET | Freq: Two times a day (BID) | ORAL | Status: DC

## 2013-07-30 MED ORDER — CEPHALEXIN 500 MG PO CAPS: 500.0000 mg | ORAL_CAPSULE | Freq: Four times a day (QID) | ORAL | Status: DC

## 2013-07-30 NOTE — ED Provider Notes (Signed)
CSN: [id number]     Arrival date & time 07/30/13  1534 History   First MD Initiated Contact with Patient 07/30/13 1607     Chief Complaint  Patient presents with  . Otalgia     (Consider location/radiation/quality/duration/timing/severity/associated sxs/prior Treatment) Patient is a 63 y.o. male presenting with ear pain. The history is provided by the patient.  Otalgia Location:  Left Behind ear:  No abnormality Quality:  Pressure and sharp Severity:  Moderate Onset quality:  Sudden Duration:  5 hours Timing:  Intermittent Progression:  Unchanged Chronicity:  New Context: not direct blow, not elevation change and not loud noise   Context comment:  Upper Resp symptoms Relieved by:  Nothing Worsened by:  Nothing tried Ineffective treatments:  None tried Associated symptoms: congestion, cough, headaches and sore throat   Associated symptoms: no fever and no vomiting   Risk factors: no recent travel     Past Medical History  Diagnosis Date  . Diabetes mellitus without complication   . Hypertension    Past Surgical History  Procedure Laterality Date  . Back surgery    . Appendectomy    . Amputation  01/01/2012    Procedure: AMPUTATION DIGIT;  Surgeon: Tami RibasKevin R Kuzma, MD;  [facility];  Service: Orthopedics;  Laterality: Right;  Right Ring and small finger revision of amputation, I&D and pinning  . Closed reduction finger with percutaneous pinning  01/01/2012    Procedure: CLOSED REDUCTION FINGER WITH PERCUTANEOUS PINNING;  Surgeon: Tami RibasKevin R Kuzma, MD;  Location: [facility] OR;  Service: Orthopedics;  Laterality: Right;   History reviewed. No pertinent family history. History  Substance Use Topics  . Smoking status: Current Every Day Smoker -- 1.50 packs/day    Types: Cigarettes  . Smokeless tobacco: Not on file  . Alcohol Use: No    Review of Systems  Constitutional: Negative for fever.  HENT: Positive for congestion, ear pain, sinus pressure and sore throat.    Respiratory: Positive for cough.   Gastrointestinal: Negative for vomiting.  Neurological: Positive for headaches.      Allergies  Penicillins  Home Medications   Prior to Admission medications   Medication Sig Start Date End Date Taking? Authorizing Provider  ALPRAZolam Prudy Feeler(XANAX) 1 MG tablet Take 1 mg by mouth 3 (three) times daily.    Historical Provider, MD  aspirin EC 81 MG tablet Take 81 mg by mouth every morning.    Historical Provider, MD  benazepril (LOTENSIN) 20 MG tablet Take 20 mg by mouth every morning.    Historical Provider, MD  doxycycline (VIBRAMYCIN) 100 MG capsule Take 1 capsule (100 mg total) by mouth 2 (two) times daily. 10/06/12   Charles B. Bernette MayersSheldon, MD  FLUoxetine (PROZAC) 20 MG capsule Take 20 mg by mouth every morning.    Historical Provider, MD  glipiZIDE (GLUCOTROL XL) 10 MG 24 hr tablet Take 10 mg by mouth every morning.    Historical Provider, MD  oxyCODONE-acetaminophen (PERCOCET) 5-325 MG per tablet 1-2 tabs po q6 hours prn pain 01/01/12   Tami RibasKevin R Kuzma, MD  sulfamethoxazole-trimethoprim (BACTRIM DS) 800-160 MG per tablet Take 1 tablet by mouth 2 (two) times daily. 01/01/12   Tami RibasKevin R Kuzma, MD   BP 146/75  Pulse 94  Temp(Src) 97.8 F (36.6 C) (Oral)  Resp 20  Ht 5\' 8"  (1.727 m)  Wt 180 lb (81.647 kg)  BMI 27.38 kg/m2  SpO2 98% Physical Exam  Nursing note and vitals reviewed. Constitutional: He is oriented to person,  place, and time. He appears well-developed and well-nourished.  Non-toxic appearance.  HENT:  Head: Normocephalic.  Right Ear: Tympanic membrane and external ear normal.  Left Ear: There is tenderness. No drainage. No mastoid tenderness. Tympanic membrane is injected and retracted. Tympanic membrane is not perforated. A middle ear effusion is present. No hemotympanum. Decreased hearing is noted.  Nasal congestion present.  Eyes: EOM and lids are normal. Pupils are equal, round, and reactive to light.  Neck: Normal range of motion. Neck  supple. Carotid bruit is not present.  Cardiovascular: Normal rate, regular rhythm, normal heart sounds, intact distal pulses and normal pulses.   Pulmonary/Chest: Breath sounds normal. No respiratory distress.  Abdominal: Soft. Bowel sounds are normal. There is no tenderness. There is no guarding.  Musculoskeletal: Normal range of motion.  Lymphadenopathy:       Head (right side): No submandibular adenopathy present.       Head (left side): No submandibular adenopathy present.    He has no cervical adenopathy.  Neurological: He is alert and oriented to person, place, and time. He has normal strength. No cranial nerve deficit or sensory deficit.  Skin: Skin is warm and dry.  Psychiatric: He has a normal mood and affect. His speech is normal.    ED Course  Procedures (including critical care time) Labs Review Labs Reviewed - No data to display  Imaging Review No results found.   EKG Interpretation None      MDM No mastoid tenderness. TM intact but retracted with fluid behind the drum and some increase redness. The uvula is swollen, and nasal congestion present. Vital signs stable. Plan: Doxycycline, Allegra D, and Norco.   Final diagnoses:  None    *I have reviewed nursing notes, vital signs, and all appropriate lab and imaging results for this patient.Kathie Dike, PA-C 07/30/13 1639

## 2013-07-30 NOTE — Discharge Instructions (Signed)
Please increase fluids. Please use Allegra D and Keflex daily until all taken. Use tylenol or ibuprofen for mild pain. Use Norco for mor severe pain. This medication may cause drowsiness,use with caution. Upper Respiratory Infection, Adult An upper respiratory infection (URI) is also known as the common cold. It is often caused by a type of germ (virus). Colds are easily spread (contagious). You can pass it to others by kissing, coughing, sneezing, or drinking out of the same glass. Usually, you get better in 1 or 2 weeks.  HOME CARE   Only take medicine as told by your doctor.  Use a warm mist humidifier or breathe in steam from a hot shower.  Drink enough water and fluids to keep your pee (urine) clear or pale yellow.  Get plenty of rest.  Return to work when your temperature is back to normal or as told by your doctor. You may use a face mask and wash your hands to stop your cold from spreading. GET HELP RIGHT AWAY IF:   After the first few days, you feel you are getting worse.  You have questions about your medicine.  You have chills, shortness of breath, or brown or red spit (mucus).  You have yellow or brown snot (nasal discharge) or pain in the face, especially when you bend forward.  You have a fever, puffy (swollen) neck, pain when you swallow, or white spots in the back of your throat.  You have a bad headache, ear pain, sinus pain, or chest pain.  You have a high-pitched whistling sound when you breathe in and out (wheezing).  You have a lasting cough or cough up blood.  You have sore muscles or a stiff neck. MAKE SURE YOU:   Understand these instructions.  Will watch your condition.  Will get help right away if you are not doing well or get worse. Document Released: 07/16/2007 Document Revised: 04/21/2011 Document Reviewed: 06/03/2010 [facility] Patient Information 2015 [place], [place]. This information is not intended to replace advice given to you by your health  care provider. Make sure you discuss any questions you have with your health care provider.

## 2013-07-30 NOTE — ED Provider Notes (Signed)
Medical screening examination/treatment/procedure(s) were performed by non-physician practitioner and as supervising physician I was immediately available for consultation/collaboration.   EKG Interpretation None        Aprille Sawhney L Erynn Vaca, MD 07/30/13 1755 

## 2013-07-30 NOTE — ED Notes (Addendum)
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

## 2015-01-19 ENCOUNTER — Emergency Department (HOSPITAL_COMMUNITY): Payer: Medicare Other

## 2015-01-19 ENCOUNTER — Emergency Department (HOSPITAL_COMMUNITY)
Admission: EM | Admit: 2015-01-19 | Discharge: 2015-01-19 | Disposition: A | Discharge status: Home / Self Care | Payer: Medicare Other | Attending: Emergency Medicine | Admitting: Emergency Medicine

## 2015-01-19 ENCOUNTER — Encounter (HOSPITAL_COMMUNITY): Payer: Self-pay | Admitting: Emergency Medicine

## 2015-01-19 DIAGNOSIS — I10 Essential (primary) hypertension: Secondary | ICD-10-CM | POA: Diagnosis not present

## 2015-01-19 DIAGNOSIS — Z7982 Long term (current) use of aspirin: Secondary | ICD-10-CM | POA: Insufficient documentation

## 2015-01-19 DIAGNOSIS — F17210 Nicotine dependence, cigarettes, uncomplicated: Secondary | ICD-10-CM | POA: Diagnosis not present

## 2015-01-19 DIAGNOSIS — G8929 Other chronic pain: Secondary | ICD-10-CM | POA: Diagnosis not present

## 2015-01-19 DIAGNOSIS — Z792 Long term (current) use of antibiotics: Secondary | ICD-10-CM | POA: Diagnosis not present

## 2015-01-19 DIAGNOSIS — S22000A Wedge compression fracture of unspecified thoracic vertebra, initial encounter for closed fracture: Secondary | ICD-10-CM

## 2015-01-19 DIAGNOSIS — Y9389 Activity, other specified: Secondary | ICD-10-CM | POA: Insufficient documentation

## 2015-01-19 DIAGNOSIS — Y9289 Other specified places as the place of occurrence of the external cause: Secondary | ICD-10-CM | POA: Diagnosis not present

## 2015-01-19 DIAGNOSIS — W01198A Fall on same level from slipping, tripping and stumbling with subsequent striking against other object, initial encounter: Secondary | ICD-10-CM | POA: Diagnosis not present

## 2015-01-19 DIAGNOSIS — Z79899 Other long term (current) drug therapy: Secondary | ICD-10-CM | POA: Diagnosis not present

## 2015-01-19 DIAGNOSIS — Y998 Other external cause status: Secondary | ICD-10-CM | POA: Insufficient documentation

## 2015-01-19 DIAGNOSIS — Z9889 Other specified postprocedural states: Secondary | ICD-10-CM | POA: Diagnosis not present

## 2015-01-19 DIAGNOSIS — M549 Dorsalgia, unspecified: Secondary | ICD-10-CM

## 2015-01-19 DIAGNOSIS — E119 Type 2 diabetes mellitus without complications: Secondary | ICD-10-CM | POA: Diagnosis not present

## 2015-01-19 DIAGNOSIS — S22060A Wedge compression fracture of T7-T8 vertebra, initial encounter for closed fracture: Secondary | ICD-10-CM | POA: Diagnosis not present

## 2015-01-19 DIAGNOSIS — M546 Pain in thoracic spine: Secondary | ICD-10-CM | POA: Diagnosis present

## 2015-01-19 MED ORDER — OXYCODONE-ACETAMINOPHEN 5-325 MG PO TABS: 1.0000 | ORAL_TABLET | ORAL | Status: DC | PRN

## 2015-01-19 MED ORDER — OXYCODONE-ACETAMINOPHEN 5-325 MG PO TABS
1.0000 | ORAL_TABLET | Freq: Once | ORAL | Status: AC
  Administered 2015-01-19: 1 via ORAL
  Filled 2015-01-19: qty 1

## 2015-01-19 NOTE — ED Provider Notes (Signed)
CSN: [id number]     Arrival date & time 01/19/15  1041 History  By signing my name below, I, Marica Otter, attest that this documentation has been prepared under the direction and in the presence of No att. providers found. Electronically Signed: Marica Otter, ED Scribe. 01/19/2015. 11:35 AM.  Chief Complaint  Patient presents with  . Back Pain   The history is provided by the patient. No language interpreter was used.   PCP: Dr. Garner Nash  HPI Comments: Cody [name] is a 64 y.o. male, with PMHx noted below including back surgery (vertebrae L4) 6 years ago and chronic lower back pain, who presents to the Emergency Department complaining of traumatic, sudden onset, unchanged, severe mid back pain onset one week ago after pt fell and hit his back on concrete steps. Pt denies head trauma or LOC resulting from the fall. Pt reports taking flexeril at home with some relief. Associated Sx include intermittent numbness and tingling to right arm. Pt denies weakness, urinary Sx, bowel Sx, urinary or bowel incontinence, chest pain, abd pain.   Past Medical History  Diagnosis Date  . Diabetes mellitus without complication (HCC)   . Hypertension    Past Surgical History  Procedure Laterality Date  . Back surgery    . Appendectomy    . Amputation  01/01/2012    Procedure: AMPUTATION DIGIT;  Surgeon: Tami Ribas, MD;  Location: [facility] OR;  Service: Orthopedics;  Laterality: Right;  Right Ring and small finger revision of amputation, I&D and pinning  . Closed reduction finger with percutaneous pinning  01/01/2012    Procedure: CLOSED REDUCTION FINGER WITH PERCUTANEOUS PINNING;  Surgeon: Tami Ribas, MD;  Location: [facility] OR;  Service: Orthopedics;  Laterality: Right;   No family history on file. Social History  Substance Use Topics  . Smoking status: Current Every Day Smoker -- 1.00 packs/day    Types: Cigarettes  . Smokeless tobacco: None  . Alcohol Use: No    Review of Systems  A complete 10 system  review of systems was obtained and all systems are negative except as noted in the HPI and PMH.    Allergies  Penicillins  Home Medications   Prior to Admission medications   Medication Sig Start Date End Date Taking? Authorizing Provider  ALPRAZolam Prudy Feeler) 1 MG tablet Take 1 mg by mouth 3 (three) times daily.    Historical Provider, MD  aspirin EC 81 MG tablet Take 81 mg by mouth every morning.    Historical Provider, MD  benazepril (LOTENSIN) 20 MG tablet Take 20 mg by mouth every morning.    Historical Provider, MD  cephALEXin (KEFLEX) 500 MG capsule Take 1 capsule (500 mg total) by mouth 4 (four) times daily. 07/30/13   Ivery Quale, PA-C  doxycycline (VIBRAMYCIN) 100 MG capsule Take 1 capsule (100 mg total) by mouth 2 (two) times daily. 10/06/12   Susy Frizzle, MD  fexofenadine-pseudoephedrine (ALLEGRA-D) 60-120 MG per tablet Take 1 tablet by mouth every 12 (twelve) hours. 07/30/13   Ivery Quale, PA-C  FLUoxetine (PROZAC) 20 MG capsule Take 20 mg by mouth every morning.    Historical Provider, MD  glipiZIDE (GLUCOTROL XL) 10 MG 24 hr tablet Take 10 mg by mouth every morning.    Historical Provider, MD  HYDROcodone-acetaminophen (NORCO/VICODIN) 5-325 MG per tablet Take 1 tablet by mouth every 4 (four) hours as needed for moderate pain. 07/30/13   Ivery Quale, PA-C  oxyCODONE-acetaminophen (PERCOCET/ROXICET) 5-325 MG tablet Take 1 tablet by  mouth every 4 (four) hours as needed for severe pain. 01/19/15   Glynn Octave, MD  sulfamethoxazole-trimethoprim (BACTRIM DS) 800-160 MG per tablet Take 1 tablet by mouth 2 (two) times daily. 01/01/12   Betha Loa, MD   Triage Vitals: BP 153/87 mmHg  Pulse 90  Resp 16  Ht  (1.727 m)  Wt 170 lb (77.111 kg)  BMI 25.85 kg/m2  SpO2 98% Physical Exam  Constitutional: He is oriented to person, place, and time. He appears well-developed and well-nourished. No distress.  HENT:  Head: Normocephalic and atraumatic.  Mouth/Throat: Oropharynx  is clear and moist. No oropharyngeal exudate.  Eyes: Conjunctivae and EOM are normal. Pupils are equal, round, and reactive to light.  Neck: Normal range of motion. Neck supple.  No meningismus.  Cardiovascular: Normal rate, regular rhythm, normal heart sounds and intact distal pulses.   No murmur heard. Pulmonary/Chest: Effort normal and breath sounds normal. No respiratory distress.  Abdominal: Soft. There is no tenderness. There is no rebound and no guarding.  Musculoskeletal: Normal range of motion. He exhibits tenderness. He exhibits no edema.  T7-8 tenderness. No step off. Well healed lumbar incision site.   Neurological: He is alert and oriented to person, place, and time. No cranial nerve deficit. He exhibits normal muscle tone. Coordination normal.  No ataxia on finger to nose bilaterally. No pronator drift. 5/5 strength throughout. CN 2-12 intact.Equal grip strength. Sensation intact.   Skin: Skin is warm.  Psychiatric: He has a normal mood and affect. His behavior is normal.  Nursing note and vitals reviewed.   ED Course  Procedures (including critical care time) DIAGNOSTIC STUDIES: Oxygen Saturation is 98% on ra, nl by my interpretation.    COORDINATION OF CARE: 11:32 AM: Discussed treatment plan which includes imaging and MRI of back with pt at bedside; patient verbalizes understanding and agrees with treatment plan.  Imaging Review Dg Thoracic Spine 2 View  01/19/2015  CLINICAL DATA:  Upper lower back pain. Slipped and fell 1 week prior. EXAM: THORACIC SPINE 2 VIEWS COMPARISON:  Bone scan 06/26/2003 FINDINGS: There is a compression fracture at the L1 which she corresponds the fracture on bone scan from 2005. No additional acute fractures are identified. IMPRESSION: Remote compression fracture at L1. No additional fractures identified. Electronically Signed   By: Genevive Bi M.D.   On: 01/19/2015 12:18   Dg Lumbar Spine Complete  01/19/2015  CLINICAL DATA:  Larey Seat on the  steps 1 week ago with subsequent pain. EXAM: LUMBAR SPINE - COMPLETE 4+ VIEW COMPARISON:  09/27/2012 FINDINGS: There is an old compression fracture at L1 with loss of height of 40% but no retropulsed bone. There is an old superior endplate fracture at L4 with loss of height of 10%. No other regional fracture. No evidence of acute fracture. No disc space narrowing. No other focal finding. IMPRESSION: No acute finding.  Old compression fractures at L1 and L4. Electronically Signed   By: Paulina Fusi M.D.   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 Plain films done earlier with prior MRI of lumbar in PACS. Prior back surgery. EXAM: MRI THORACIC SPINE WITHOUT CONTRAST TECHNIQUE: Multiplanar, multisequence MR imaging of the thoracic spine was performed. No intravenous contrast was administered. COMPARISON:  None. FINDINGS: T7 vertebral body height loss with marrow edema throughout the  vertebral body with a linear component paralleling the inferior endplate consistent with a acute compression fracture. There is no retropulsion. There is approximately 15% height loss. There is a chronic L1 vertebral body compression fracture without marrow edema. The remainder the vertebral body heights are maintained. The alignment is anatomic. There is no acute fracture or static listhesis. The marrow signal is otherwise normal. The thoracic spinal cord is normal in size and signal. The disc spaces are maintained. T1-T2: No significant disc protrusion, foraminal stenosis or central canal stenosis. T2-T3: No significant disc protrusion, foraminal stenosis or central canal stenosis. T3-T4: No significant disc protrusion, foraminal stenosis or central canal stenosis. T4-T5: No significant disc protrusion, foraminal stenosis or central canal stenosis. T5-T6: No significant  disc protrusion, foraminal stenosis or central canal stenosis. T6-T7: No significant disc protrusion, foraminal stenosis or central canal stenosis. T7-T8: No significant disc protrusion, foraminal stenosis or central canal stenosis. T8-T9: No significant disc protrusion, foraminal stenosis or central canal stenosis. T9-T10: No significant disc protrusion, foraminal stenosis or central canal stenosis. T10-T11: No significant disc protrusion, foraminal stenosis or central canal stenosis. T11-T12: No significant disc protrusion, foraminal stenosis or central canal stenosis. IMPRESSION: 1. Acute T7 vertebral body compression fracture with approximately 15% height loss. 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 Initial encounter. EXAM: MRI LUMBAR SPINE WITHOUT CONTRAST TECHNIQUE: Multiplanar, multisequence MR imaging of the lumbar spine was performed. No intravenous contrast was administered. COMPARISON:  Lumbar radiographs 01/19/2015 and earlier. CT Abdomen and Pelvis 08/14/2006. FINDINGS: Normal lumbar segmentation. Chronic L1 compression fracture. Stable vertebral height and alignment since 2008. No marrow edema or evidence of acute osseous abnormality. Visualized lower thoracic spinal cord is normal with conus medularis at L1. Visualized abdominal viscera and paraspinal soft tissues are within normal limits. T10-T11:  Mild ligament flavum hypertrophy. T11-T12:  Mild facet hypertrophy.  Mild T11 foraminal stenosis. T12-L1: Negative aside from minimal chronic retropulsion of the posterior superior L1 endplate, no significant stenosis. L1-L2:  Mild disc desiccation and circumferential disc bulge. L2-L3: Mild disc desiccation and circumferential disc bulge. Broad-based right foraminal component with annular fissure (series 5,  image 5) in proximity to the exiting right L2 nerve. No other stenosis. L3-L4: Mild circumferential disc bulge. Mild to moderate facet and ligament flavum hypertrophy. Borderline to mild spinal stenosis. Mild to moderate left L3 foraminal stenosis (series 8, image 26). L4-L5: Minimal disc bulge. Mild facet hypertrophy greater on the right. Mild bilateral L4 foraminal stenosis. L5-S1: Mild posterior disc bulge and facet hypertrophy. No stenosis. IMPRESSION: 1. Symptomatic level may be L2-L3 where a small foraminal disc herniation with annular fissure could affect the exiting right L2 nerve. 2. Borderline to mild multifactorial spinal stenosis at L3-L4. 3. Other mild to moderate neural foraminal stenosis at the left L3 and bilateral L4 nerve levels. 4. Chronic L1 compression fracture, stable since 2008. Electronically Signed   By: Odessa Fleming M.D.   On: 01/19/2015 13:03   I have personally reviewed and evaluated these images as part of my medical decision-making.   MDM   Final diagnoses:  Mid back pain  Thoracic compression fracture, closed, initial encounter (HCC)   mid back pain after a fall last week. Intermittent numbness and tingling in right arm that comes and goes lasting a few seconds at a  time. History of lumbar back pain but thoracic back pain is new. Concern for compression fracture.]  Equal grip strengths, radial pulses and sensation.  Imaging shows acute T7 compression fracture. There are old compression fractures of L1 and L4.  MRI results discussed with Dr. Dutch Quint who is covering for Dr. Newell Coral. He recommends pain control and follow-up with Dr. Newell Coral next week. No evidence of acute nerve impingement. There is a bulging disc at L2-L3. Patient with equal strength and sensation in his arms and legs.  Patient ambulatory.  Pain is controlled.  Follow up with Dr. Newell Coral this week. Return precautions discussed.    I personally performed the services described in this documentation,  which was scribed in my presence. The recorded information has been reviewed and is accurate.    Glynn Octave, MD 01/19/15 [id number]

## 2015-01-19 NOTE — ED Notes (Signed)
Pt reports mid back pain since last Friday. Pt reports worsening numbness and tingling to RT arm since he injured his back. Pt normally has mild numbness/tingling due to past injury. Pt also reports difficulty ambulating. Pt had back surgery 6 years ago.

## 2015-01-19 NOTE — ED Notes (Signed)
MD Rancour at bedside 

## 2015-01-19 NOTE — Discharge Instructions (Signed)
Spinal Compression Fracture Take the pain medication as prescribed. Do not take it if you are working or driving.  Follow up with Dr. Sherwood Gambler next week. Return to the ED if you develop new or worsening symptoms. A spinal compression fracture is a collapse of the bones that form the spine (vertebrae). With this type of fracture, the vertebrae become squashed (compressed) into a wedge shape. Most compression fractures happen in the middle or lower part of the spine. CAUSES This condition may be caused by:  Thinning and loss of density in the bones (osteoporosis). This is the most common cause.  A fall.  A car or motorcycle accident.  Cancer.  Trauma, such as a heavy, direct hit to the head. RISK FACTORS You may be at greater risk for a spinal compression fracture if you:  Are 57 years old or older.  Have osteoporosis.  Have certain types of cancer, including:  Multiple myeloma.  Lymphoma.  Prostate cancer.  Lung cancer.  Breast cancer. SYMPTOMS Symptoms of this condition include:  Severe pain.  Pain that gets worse over time.  Pain that is worse when you stand, walk, sit, or bend.  Sudden pain that is so bad that it is hard for you to move.  Bending or humping of the spine.  Gradual loss of height.  Numbness, tingling, or weakness in the back and legs.  Trouble walking. Your symptoms will depend on the cause of the fracture and how quickly it develops. For example, fractures that are caused by osteoporosis can cause few symptoms, no symptoms, or symptoms that develop slowly over time. DIAGNOSIS This condition may be diagnosed based on symptoms, medical history, and a physical exam. During the physical exam, your health care provider may tap along the length of your spine to check for tenderness. Tests may be done to confirm the diagnosis. They may include:  A bone density test to check for osteoporosis.  Imaging tests, such as a spine X-ray, a CT scan, or  MRI. TREATMENT Treatment for this condition depends on the cause and severity of the condition.Some fractures, such as those that are caused by osteoporosis, may heal on their own with supportive care. This may include:  Pain medicine.  Rest.  A back brace.  Physical therapy exercises.  Medicine that reduces bone pain.  Calcium and vitamin D supplements. Fractures that cause the back to become misshapen, cause nerve pain or weakness, or do not respond to other treatment may be treated with a surgical procedure, such as:  Vertebroplasty. In this procedure, bone cement is injected into the collapsed vertebrae to stabilize them.  Balloon kyphoplasty. In this procedure, the collapsed vertebrae are expanded with a balloon and then bone cement is injected into them.  Spinal fusion. In this procedure, the collapsed vertebrae are connected (fused) to normal vertebrae. HOME CARE INSTRUCTIONS General Instructions  Take medicines only as directed by your health care provider.  Do not drive or operate heavy machinery while taking pain medicine.  If directed, apply ice to the injured area:  Put ice in a plastic bag.  Place a towel between your skin and the bag.  Leave the ice on for 30 minutes every two hours at first. Then apply the ice as needed.  Wear your neck brace or back brace as directed by your health care provider.  Do not drink alcohol. Alcohol can interfere with your treatment.  Keep all follow-up visits as directed by your health care provider. This is important. It  can help to prevent permanent injury, disability, and long-lasting (chronic) pain. Activity  Stay in bed (on bed rest) only as directed by your health care provider. Being on bed rest for too long can make your condition worse.  Return to your normal activities as directed by your health care provider. Ask what activities are safe for you.  Do exercises to improve motion and strength in your back (physical  therapy), as recommended by your health care provider.  Exercise regularly as directed by your health care provider. SEEK MEDICAL CARE IF:  You have a fever.  You develop a cough that makes your pain worse.  Your pain medicine is not helping.  Your pain does not get better over time.  You cannot return to your normal activities as planned or expected. SEEK IMMEDIATE MEDICAL CARE IF:  Your pain is very bad and it suddenly gets worse.  You are unable to move any body part (paralysis) that is below the level of your injury.  You have numbness, tingling, or weakness in any body part that is below the level of your injury.  You cannot control your bladder or bowels.   This information is not intended to replace advice given to you by your health care provider. Make sure you discuss any questions you have with your health care provider.   Document Released: 01/27/2005 Document Revised: 06/13/2014 Document Reviewed: 01/31/2014 Elsevier Interactive Patient Education [employer].

## 2015-11-11 ENCOUNTER — Emergency Department (HOSPITAL_COMMUNITY)
Admission: EM | Admit: 2015-11-11 | Discharge: 2015-11-11 | Disposition: A | Discharge status: Home / Self Care | Payer: Medicare HMO | Attending: Emergency Medicine | Admitting: Emergency Medicine

## 2015-11-11 ENCOUNTER — Emergency Department (HOSPITAL_COMMUNITY): Payer: Medicare HMO

## 2015-11-11 ENCOUNTER — Encounter (HOSPITAL_COMMUNITY): Payer: Self-pay

## 2015-11-11 DIAGNOSIS — F17210 Nicotine dependence, cigarettes, uncomplicated: Secondary | ICD-10-CM | POA: Insufficient documentation

## 2015-11-11 DIAGNOSIS — I10 Essential (primary) hypertension: Secondary | ICD-10-CM | POA: Insufficient documentation

## 2015-11-11 DIAGNOSIS — Z7982 Long term (current) use of aspirin: Secondary | ICD-10-CM | POA: Insufficient documentation

## 2015-11-11 DIAGNOSIS — Z7984 Long term (current) use of oral hypoglycemic drugs: Secondary | ICD-10-CM | POA: Insufficient documentation

## 2015-11-11 DIAGNOSIS — Z791 Long term (current) use of non-steroidal anti-inflammatories (NSAID): Secondary | ICD-10-CM | POA: Insufficient documentation

## 2015-11-11 DIAGNOSIS — Z79899 Other long term (current) drug therapy: Secondary | ICD-10-CM | POA: Insufficient documentation

## 2015-11-11 DIAGNOSIS — E119 Type 2 diabetes mellitus without complications: Secondary | ICD-10-CM | POA: Insufficient documentation

## 2015-11-11 DIAGNOSIS — R0602 Shortness of breath: Secondary | ICD-10-CM | POA: Diagnosis present

## 2015-11-11 DIAGNOSIS — J441 Chronic obstructive pulmonary disease with (acute) exacerbation: Secondary | ICD-10-CM | POA: Insufficient documentation

## 2015-11-11 LAB — COMPREHENSIVE METABOLIC PANEL
ALBUMIN: 3.6 g/dL (ref 3.5–5.0)
ALK PHOS: 73 U/L (ref 38–126)
ALT: 22 U/L (ref 17–63)
ANION GAP: 7 (ref 5–15)
AST: 19 U/L (ref 15–41)
BUN: 11 mg/dL (ref 6–20)
CALCIUM: 8.3 mg/dL — AB (ref 8.9–10.3)
CHLORIDE: 102 mmol/L (ref 101–111)
CO2: 25 mmol/L (ref 22–32)
Creatinine, Ser: 0.96 mg/dL (ref 0.61–1.24)
GFR calc non Af Amer: >60 mL/min (ref >60)
GLUCOSE: 291 mg/dL — AB (ref 65–99)
POTASSIUM: 3.2 mmol/L — AB (ref 3.5–5.1)
SODIUM: 134 mmol/L — AB (ref 135–145)
Total Bilirubin: 0.6 mg/dL (ref 0.3–1.2)
Total Protein: 6.5 g/dL (ref 6.5–8.1)

## 2015-11-11 LAB — CBC
HCT: 38 % — ABNORMAL LOW (ref 39.0–52.0)
Hemoglobin: 13.1 g/dL (ref 13.0–17.0)
MCH: 33.7 pg (ref 26.0–34.0)
MCHC: 34.5 g/dL (ref 30.0–36.0)
MCV: 97.7 fL (ref 78.0–100.0)
PLATELETS: 211 10*3/uL (ref 150–400)
RBC: 3.89 MIL/uL — ABNORMAL LOW (ref 4.22–5.81)
RDW: 14.2 % (ref 11.5–15.5)
WBC: 7.4 10*3/uL (ref 4.0–10.5)

## 2015-11-11 LAB — I-STAT CG4 LACTIC ACID, ED: LACTIC ACID, VENOUS: 1.63 mmol/L (ref 0.5–1.9)

## 2015-11-11 LAB — TROPONIN I: Troponin I: <0.03 ng/mL (ref <0.03)

## 2015-11-11 LAB — BRAIN NATRIURETIC PEPTIDE: B NATRIURETIC PEPTIDE 5: 122 pg/mL — AB (ref 0.0–100.0)

## 2015-11-11 MED ORDER — PREDNISONE 20 MG PO TABS: 40.0000 mg | ORAL_TABLET | Freq: Every day | ORAL | 0 refills | Status: DC

## 2015-11-11 MED ORDER — ALBUTEROL (5 MG/ML) CONTINUOUS INHALATION SOLN
10.0000 mg/h | INHALATION_SOLUTION | Freq: Once | RESPIRATORY_TRACT | Status: AC
  Administered 2015-11-11: 10 mg/h via RESPIRATORY_TRACT
  Filled 2015-11-11: qty 20

## 2015-11-11 MED ORDER — ALBUTEROL SULFATE (2.5 MG/3ML) 0.083% IN NEBU: 5.0000 mg | INHALATION_SOLUTION | Freq: Once | RESPIRATORY_TRACT | Status: DC

## 2015-11-11 MED ORDER — AZITHROMYCIN 250 MG PO TABS: 250.0000 mg | ORAL_TABLET | Freq: Every day | ORAL | 0 refills | Status: AC

## 2015-11-11 MED ORDER — AZITHROMYCIN 250 MG PO TABS
500.0000 mg | ORAL_TABLET | Freq: Once | ORAL | Status: AC
  Administered 2015-11-11: 500 mg via ORAL
  Filled 2015-11-11: qty 2

## 2015-11-11 MED ORDER — ALBUTEROL SULFATE (2.5 MG/3ML) 0.083% IN NEBU
2.5000 mg | INHALATION_SOLUTION | Freq: Once | RESPIRATORY_TRACT | Status: AC
  Administered 2015-11-11: 2.5 mg via RESPIRATORY_TRACT

## 2015-11-11 NOTE — ED Provider Notes (Signed)
[facility]-EMERGENCY DEPT Provider Note   CSN: [id number] Arrival date & time: 11/11/15  1932 By signing my name below, I, Bridgette HabermannMaria Tan, attest that this documentation has been prepared under the direction and in the presence of Gerhard Munchobert Rockwell Zentz, MD. Electronically Signed: Bridgette HabermannMaria Tan, ED Scribe. 11/11/15. 8:12 PM.  History   Chief Complaint Chief Complaint  Patient presents with  . Shortness of Breath   HPI Comments: Thereasa ParkinKent W Carfagno is a 65 y.o. male with h/o COPD and emphysema who presents to the Emergency Department complaining of shortness of breath onset two weeks ago, worsening tonight. He has associated chest tightness and cough. Pt states that he also had a fever today (Tmax 101). Pain is exacerbated when laying flat. He has taken 4 Ibuprofen 45 minutes ago with moderate relief to the fever. Pt also went to Dr. Scharlene GlossHall's office and was given an inhaler and medications with no relief. Denies h/o heart disease. He denies leg swelling, weight gain, confusion, syncope, vomiting, or any other associated symptoms.  The history is provided by the patient. No language interpreter was used.    Past Medical History:  Diagnosis Date  . Diabetes mellitus without complication (HCC)   . Hypertension     There are no active problems to display for this patient.   Past Surgical History:  Procedure Laterality Date  . AMPUTATION  01/01/2012   Procedure: AMPUTATION DIGIT;  Surgeon: Tami RibasKevin R Kuzma, MD;  [facility];  Service: Orthopedics;  Laterality: Right;  Right Ring and small finger revision of amputation, I&D and pinning  . APPENDECTOMY    . BACK SURGERY    . CLOSED REDUCTION FINGER WITH PERCUTANEOUS PINNING  01/01/2012   Procedure: CLOSED REDUCTION FINGER WITH PERCUTANEOUS PINNING;  Surgeon: Tami RibasKevin R Kuzma, MD;  Location: [facility] OR;  Service: Orthopedics;  Laterality: Right;       Home Medications    Prior to Admission medications   Medication Sig Start Date End Date Taking? Authorizing Provider    albuterol (PROAIR HFA) 108 (90 Base) MCG/ACT inhaler Inhale 1-2 puffs into the lungs every 6 (six) hours as needed for wheezing or shortness of breath.   Yes Historical Provider, MD  ALPRAZolam Prudy Feeler(XANAX) 1 MG tablet Take 1 mg by mouth 3 (three) times daily.   Yes Historical Provider, MD  aspirin EC 81 MG tablet Take 81 mg by mouth every morning.   Yes Historical Provider, MD  benazepril (LOTENSIN) 40 MG tablet Take 40 mg by mouth every morning.    Yes Historical Provider, MD  Dextromethorphan-Guaifenesin (MUCINEX DM MAXIMUM STRENGTH) 60-1200 MG TB12 Take 1 tablet by mouth every 12 (twelve) hours as needed (for congestion).   Yes Historical Provider, MD  glipiZIDE (GLUCOTROL XL) 10 MG 24 hr tablet Take 10 mg by mouth every evening.    Yes Historical Provider, MD  ibuprofen (ADVIL,MOTRIN) 200 MG tablet Take 200 mg by mouth every 6 (six) hours as needed for mild pain or moderate pain.   Yes Historical Provider, MD  metFORMIN (GLUCOPHAGE-XR) 500 MG 24 hr tablet Take 1,000 mg by mouth 2 (two) times daily.   Yes Historical Provider, MD  omeprazole (PRILOSEC) 20 MG capsule Take 20 mg by mouth daily.   Yes Historical Provider, MD  oxyCODONE-acetaminophen (PERCOCET/ROXICET) 5-325 MG tablet Take 1 tablet by mouth every 4 (four) hours as needed for severe pain. Patient taking differently: Take 1.5 tablets by mouth every 4 (four) hours as needed for severe pain.  01/19/15  Yes Glynn OctaveStephen Rancour, MD  pioglitazone (ACTOS) 15 MG tablet Take 15 mg by mouth daily.   Yes Historical Provider, MD  rosuvastatin (CRESTOR) 5 MG tablet Take 5 mg by mouth every morning.   Yes Historical Provider, MD  venlafaxine XR (EFFEXOR-XR) 150 MG 24 hr capsule Take 150 mg by mouth daily with breakfast.   Yes Historical Provider, MD  azithromycin (ZITHROMAX) 250 MG tablet Take 1 tablet (250 mg total) by mouth daily. Take 1 every day until finished. 11/12/15 11/16/15  Gerhard Munch, MD  predniSONE (DELTASONE) 20 MG tablet Take 2 tablets (40  mg total) by mouth daily with breakfast. For the next four days 11/11/15   Gerhard Munch, MD    Family History No family history on file.  Social History Social History  Substance Use Topics  . Smoking status: Current Every Day Smoker    Packs/day: 1.00    Types: Cigarettes  . Smokeless tobacco: Never Used  . Alcohol use 6.0 oz/week    10 Cans of beer per week     Comment: week     Allergies   Penicillins   Review of Systems Review of Systems  Constitutional:       Per HPI, otherwise negative  HENT:       Per HPI, otherwise negative  Respiratory:       Per HPI, otherwise negative  Cardiovascular:       Per HPI, otherwise negative  Gastrointestinal: Negative for vomiting.  Endocrine:       Negative aside from HPI  Genitourinary:       Neg aside from HPI   Musculoskeletal:       Per HPI, otherwise negative  Skin: Negative.   Neurological: Positive for weakness. Negative for syncope.     Physical Exam Updated Vital Signs BP 170/84 (BP Location: Left Arm)   Pulse 94   Temp 98.3 F (36.8 C) (Oral)   Resp 18   Ht 5\' 8"  (1.727 m)   Wt 185 lb (83.9 kg)   SpO2 95%   BMI 28.13 kg/m   Physical Exam  Constitutional: He is oriented to person, place, and time. He appears well-developed. No distress.  HENT:  Head: Normocephalic and atraumatic.  Eyes: Conjunctivae and EOM are normal.  Cardiovascular: Normal rate and regular rhythm.   Pulmonary/Chest: No stridor. Tachypnea noted. He has decreased breath sounds. He has wheezes in the right upper field.  Abdominal: He exhibits no distension.    Musculoskeletal: He exhibits no edema.  Multiple amputated digits, right hand  Neurological: He is alert and oriented to person, place, and time.  Skin: Skin is warm and dry.  Psychiatric: He has a normal mood and affect.  Nursing note and vitals reviewed.    ED Treatments / Results  DIAGNOSTIC STUDIES: Oxygen Saturation is 97% on RA, adequate by my interpretation.     COORDINATION OF CARE: 8:11 PM Discussed treatment plan with pt at bedside which includes CXR and breathing treatment and pt agreed to plan.  Labs (all labs ordered are listed, but only abnormal results are displayed) Labs Reviewed  CBC - Abnormal; Notable for the following:       Result Value   RBC 3.89 (*)    HCT 38.0 (*)    All other components within normal limits  COMPREHENSIVE METABOLIC PANEL - Abnormal; Notable for the following:    Sodium 134 (*)    Potassium 3.2 (*)    Glucose, Bld 291 (*)    Calcium 8.3 (*)  All other components within normal limits  BRAIN NATRIURETIC PEPTIDE - Abnormal; Notable for the following:    B Natriuretic Peptide 122.0 (*)    All other components within normal limits  TROPONIN I  I-STAT CG4 LACTIC ACID, ED    EKG  EKG Interpretation  Date/Time:  Sunday November 11 2015 19:50:19 EDT Ventricular Rate:  82 PR Interval:    QRS Duration: 107 QT Interval:  378 QTC Calculation: 442 R Axis:   -62 Text Interpretation:  Sinus rhythm Atrial premature complexes Left anterior fascicular block Anteroseptal infarct, old ST-t wave abnormality Abnormal ekg Confirmed by Gerhard Munch  MD [phone]) on 11/11/2015 8:33:01 PM       Radiology Dg Chest Portable 1 View  Result Date: 11/11/2015 CLINICAL DATA:  Cough with shortness breath for 1 week. Low grade fever today. History of diabetes and hypertension. EXAM: PORTABLE CHEST 1 VIEW COMPARISON:  Thoracic spine radiographs 02/13/2015 and 04/13/2015. Chest CT 06/14/2003. No prior chest radiographs available. FINDINGS: 2011 hours. The heart size and mediastinal contours are stable. The lungs are clear. There is no pleural effusion or pneumothorax. No acute osseous findings are seen. There is a mild convex left thoracic scoliosis. IMPRESSION: No active cardiopulmonary process. Electronically Signed   By: Carey Bullocks M.D.   On: 11/11/2015 20:40    Procedures Procedures (including critical care  time)  Medications Ordered in ED Medications  albuterol (PROVENTIL) (2.5 MG/3ML) 0.083% nebulizer solution 5 mg (0 mg Nebulization Hold 11/11/15 2123)  azithromycin (ZITHROMAX) tablet 500 mg (not administered)  albuterol (PROVENTIL,VENTOLIN) solution continuous neb (10 mg/hr Nebulization Given 11/11/15 2020)  albuterol (PROVENTIL) (2.5 MG/3ML) 0.083% nebulizer solution 2.5 mg (2.5 mg Nebulization Given 11/11/15 2135)     Initial Impression / Assessment and Plan / ED Course  I have reviewed the triage vital signs and the nursing notes.  Pertinent labs & imaging results that were available during my care of the patient were reviewed by me and considered in my medical decision making (see chart for details).  Clinical Course    Patient substantially better after multiple albuterol sessions. I reviewed all findings with patient and multiple family members. Given the patient's recurrent fever at home, though the portable chest x-ray does not demonstrate obvious pneumonia, patient will receive antibiotics in addition to steroids. Patient has close outpatient follow-up potential.  Elderly male with COPD presents with worsening fever, dyspnea. Given the description of symptoms, some concern for pneumonia, and clinically the patient may have this, though with substantial improvement, he remains appropriate for outpatient management after initiation of steroids, antibiotics as well as scheduled albuterol sessions. Here no evidence for decompensated heart failure, and the patient has no history of this. No evidence for ongoing coronary ischemia either.  Final Clinical Impressions(s) / ED Diagnoses   Final diagnoses:  COPD exacerbation (HCC)    I personally performed the services described in this documentation, which was scribed in my presence. The recorded information has been reviewed and is accurate.      New Prescriptions New Prescriptions   AZITHROMYCIN (ZITHROMAX) 250 MG TABLET     Take 1 tablet (250 mg total) by mouth daily. Take 1 every day until finished.   PREDNISONE (DELTASONE) 20 MG TABLET    Take 2 tablets (40 mg total) by mouth daily with breakfast. For the next four days     Gerhard Munch, MD 11/11/15 2219

## 2015-11-11 NOTE — Discharge Instructions (Signed)
As discussed, your evaluation today has been largely reassuring.  But, it is important that you monitor your condition carefully, and do not hesitate to return to the ED if you develop new, or concerning changes in your condition.  For the next 2 days please use your albuterol inhaler every 4 hours.  In addition, you have been prescribed additional medication, for the next 5 days, please take this as directed.

## 2015-11-11 NOTE — ED Triage Notes (Signed)
Patient reports of shortness of breath/cough over 1 week. States he was seen at Dr. Marcelo Baldy office and given inhaler and medications but no relief. Patient states today he started fever over 101. Had 4 ibuprofen 45 min. Ago. Generalized body aches.

## 2016-01-05 ENCOUNTER — Emergency Department (HOSPITAL_COMMUNITY): Payer: Medicare HMO

## 2016-01-05 ENCOUNTER — Encounter (HOSPITAL_COMMUNITY): Payer: Self-pay

## 2016-01-05 ENCOUNTER — Other Ambulatory Visit: Payer: Self-pay

## 2016-01-05 ENCOUNTER — Inpatient Hospital Stay (HOSPITAL_COMMUNITY)
Admission: EM | Admit: 2016-01-05 | Discharge: 2016-01-08 | DRG: 291 | Disposition: A | Discharge status: Home / Self Care | Payer: Medicare HMO | Attending: Internal Medicine | Admitting: Internal Medicine

## 2016-01-05 DIAGNOSIS — I219 Acute myocardial infarction, unspecified: Secondary | ICD-10-CM

## 2016-01-05 DIAGNOSIS — J449 Chronic obstructive pulmonary disease, unspecified: Secondary | ICD-10-CM | POA: Diagnosis present

## 2016-01-05 DIAGNOSIS — I255 Ischemic cardiomyopathy: Secondary | ICD-10-CM | POA: Diagnosis present

## 2016-01-05 DIAGNOSIS — Z23 Encounter for immunization: Secondary | ICD-10-CM

## 2016-01-05 DIAGNOSIS — R0602 Shortness of breath: Secondary | ICD-10-CM | POA: Diagnosis present

## 2016-01-05 DIAGNOSIS — J9691 Respiratory failure, unspecified with hypoxia: Secondary | ICD-10-CM | POA: Diagnosis present

## 2016-01-05 DIAGNOSIS — F17210 Nicotine dependence, cigarettes, uncomplicated: Secondary | ICD-10-CM | POA: Diagnosis present

## 2016-01-05 DIAGNOSIS — J9601 Acute respiratory failure with hypoxia: Secondary | ICD-10-CM

## 2016-01-05 DIAGNOSIS — D72829 Elevated white blood cell count, unspecified: Secondary | ICD-10-CM | POA: Diagnosis present

## 2016-01-05 DIAGNOSIS — I110 Hypertensive heart disease with heart failure: Secondary | ICD-10-CM | POA: Diagnosis present

## 2016-01-05 DIAGNOSIS — E876 Hypokalemia: Secondary | ICD-10-CM | POA: Diagnosis present

## 2016-01-05 DIAGNOSIS — J441 Chronic obstructive pulmonary disease with (acute) exacerbation: Secondary | ICD-10-CM | POA: Diagnosis not present

## 2016-01-05 DIAGNOSIS — R079 Chest pain, unspecified: Secondary | ICD-10-CM | POA: Diagnosis not present

## 2016-01-05 DIAGNOSIS — J9690 Respiratory failure, unspecified, unspecified whether with hypoxia or hypercapnia: Secondary | ICD-10-CM | POA: Diagnosis present

## 2016-01-05 DIAGNOSIS — J9621 Acute and chronic respiratory failure with hypoxia: Secondary | ICD-10-CM | POA: Diagnosis present

## 2016-01-05 DIAGNOSIS — E119 Type 2 diabetes mellitus without complications: Secondary | ICD-10-CM | POA: Diagnosis present

## 2016-01-05 DIAGNOSIS — R000 Tachycardia, unspecified: Secondary | ICD-10-CM | POA: Diagnosis present

## 2016-01-05 DIAGNOSIS — I10 Essential (primary) hypertension: Secondary | ICD-10-CM | POA: Diagnosis not present

## 2016-01-05 DIAGNOSIS — I5033 Acute on chronic diastolic (congestive) heart failure: Secondary | ICD-10-CM | POA: Diagnosis present

## 2016-01-05 DIAGNOSIS — J181 Lobar pneumonia, unspecified organism: Secondary | ICD-10-CM

## 2016-01-05 DIAGNOSIS — Z9114 Patient's other noncompliance with medication regimen: Secondary | ICD-10-CM

## 2016-01-05 DIAGNOSIS — J811 Chronic pulmonary edema: Secondary | ICD-10-CM | POA: Diagnosis present

## 2016-01-05 DIAGNOSIS — J189 Pneumonia, unspecified organism: Secondary | ICD-10-CM

## 2016-01-05 DIAGNOSIS — J810 Acute pulmonary edema: Secondary | ICD-10-CM

## 2016-01-05 HISTORY — DX: Acute myocardial infarction, unspecified: I21.9

## 2016-01-05 HISTORY — DX: Unspecified asthma, uncomplicated: J45.909

## 2016-01-05 HISTORY — DX: Chronic obstructive pulmonary disease, unspecified: J44.9

## 2016-01-05 LAB — CBC WITH DIFFERENTIAL/PLATELET
BASOS ABS: 0 10*3/uL (ref 0.0–0.1)
BASOS PCT: 0 %
EOS ABS: 0.2 10*3/uL (ref 0.0–0.7)
Eosinophils Relative: 2 %
HCT: 47.7 % (ref 39.0–52.0)
Hemoglobin: 16.5 g/dL (ref 13.0–17.0)
Lymphocytes Relative: 22 %
Lymphs Abs: 2.4 10*3/uL (ref 0.7–4.0)
MCH: 33.7 pg (ref 26.0–34.0)
MCHC: 34.6 g/dL (ref 30.0–36.0)
MCV: 97.5 fL (ref 78.0–100.0)
MONO ABS: 0.5 10*3/uL (ref 0.1–1.0)
MONOS PCT: 5 %
NEUTROS ABS: 7.6 10*3/uL (ref 1.7–7.7)
NEUTROS PCT: 71 %
Platelets: 264 10*3/uL (ref 150–400)
RBC: 4.89 MIL/uL (ref 4.22–5.81)
RDW: 13.3 % (ref 11.5–15.5)
WBC: 10.8 10*3/uL — ABNORMAL HIGH (ref 4.0–10.5)

## 2016-01-05 LAB — GLUCOSE, CAPILLARY
GLUCOSE-CAPILLARY: 250 mg/dL — AB (ref 65–99)
GLUCOSE-CAPILLARY: 299 mg/dL — AB (ref 65–99)
GLUCOSE-CAPILLARY: 307 mg/dL — AB (ref 65–99)
Glucose-Capillary: 300 mg/dL — ABNORMAL HIGH (ref 65–99)

## 2016-01-05 LAB — COMPREHENSIVE METABOLIC PANEL
ALBUMIN: 4 g/dL (ref 3.5–5.0)
ALT: 59 U/L (ref 17–63)
ANION GAP: 10 (ref 5–15)
AST: 74 U/L — AB (ref 15–41)
Alkaline Phosphatase: 83 U/L (ref 38–126)
BUN: 16 mg/dL (ref 6–20)
CHLORIDE: 105 mmol/L (ref 101–111)
CO2: 24 mmol/L (ref 22–32)
Calcium: 7.9 mg/dL — ABNORMAL LOW (ref 8.9–10.3)
Creatinine, Ser: 1 mg/dL (ref 0.61–1.24)
GFR calc Af Amer: >60 mL/min (ref >60)
GFR calc non Af Amer: >60 mL/min (ref >60)
GLUCOSE: 278 mg/dL — AB (ref 65–99)
POTASSIUM: 3.1 mmol/L — AB (ref 3.5–5.1)
SODIUM: 139 mmol/L (ref 135–145)
Total Bilirubin: 0.4 mg/dL (ref 0.3–1.2)
Total Protein: 7.3 g/dL (ref 6.5–8.1)

## 2016-01-05 LAB — BLOOD GAS, ARTERIAL
ACID-BASE DEFICIT: 1 mmol/L (ref 0.0–2.0)
BICARBONATE: 23 mmol/L (ref 20.0–28.0)
DELIVERY SYSTEMS: POSITIVE
Drawn by: 105551
EXPIRATORY PAP: 6
FIO2: 100
INSPIRATORY PAP: 14
O2 SAT: 96.5 %
PCO2 ART: 46.4 mmHg (ref 32.0–48.0)
PH ART: 7.335 — AB (ref 7.350–7.450)
PO2 ART: 99.1 mmHg (ref 83.0–108.0)
RATE: 8 resp/min

## 2016-01-05 LAB — I-STAT CG4 LACTIC ACID, ED: LACTIC ACID, VENOUS: 1.71 mmol/L (ref 0.5–1.9)

## 2016-01-05 LAB — BRAIN NATRIURETIC PEPTIDE: B Natriuretic Peptide: 138 pg/mL — ABNORMAL HIGH (ref 0.0–100.0)

## 2016-01-05 LAB — TROPONIN I: Troponin I: 0.03 ng/mL (ref <0.03)

## 2016-01-05 LAB — TSH: TSH: 0.885 u[IU]/mL (ref 0.350–4.500)

## 2016-01-05 LAB — MRSA PCR SCREENING: MRSA BY PCR: NEGATIVE

## 2016-01-05 MED ORDER — ASPIRIN EC 81 MG PO TBEC
81.0000 mg | DELAYED_RELEASE_TABLET | Freq: Every morning | ORAL | Status: DC
  Administered 2016-01-05 – 2016-01-08 (×4): 81 mg via ORAL
  Filled 2016-01-05 (×4): qty 1

## 2016-01-05 MED ORDER — INSULIN ASPART 100 UNIT/ML ~~LOC~~ SOLN
0.0000 [IU] | Freq: Every day | SUBCUTANEOUS | Status: DC
  Administered 2016-01-05: 3 [IU] via SUBCUTANEOUS
  Administered 2016-01-06: 5 [IU] via SUBCUTANEOUS

## 2016-01-05 MED ORDER — FUROSEMIDE 10 MG/ML IJ SOLN
40.0000 mg | Freq: Once | INTRAMUSCULAR | Status: AC
  Administered 2016-01-05: 40 mg via INTRAVENOUS
  Filled 2016-01-05: qty 4

## 2016-01-05 MED ORDER — ALBUTEROL SULFATE (2.5 MG/3ML) 0.083% IN NEBU
2.5000 mg | INHALATION_SOLUTION | Freq: Four times a day (QID) | RESPIRATORY_TRACT | Status: DC
  Administered 2016-01-05 (×3): 2.5 mg via RESPIRATORY_TRACT
  Filled 2016-01-05 (×3): qty 3

## 2016-01-05 MED ORDER — ROSUVASTATIN CALCIUM 10 MG PO TABS
5.0000 mg | ORAL_TABLET | Freq: Every morning | ORAL | Status: DC
  Administered 2016-01-05 – 2016-01-08 (×4): 5 mg via ORAL
  Filled 2016-01-05 (×4): qty 1

## 2016-01-05 MED ORDER — IPRATROPIUM BROMIDE 0.02 % IN SOLN
0.5000 mg | Freq: Once | RESPIRATORY_TRACT | Status: AC
  Administered 2016-01-05: 0.5 mg via RESPIRATORY_TRACT
  Filled 2016-01-05: qty 2.5

## 2016-01-05 MED ORDER — ONDANSETRON HCL 4 MG/2ML IJ SOLN: 4.0000 mg | Freq: Four times a day (QID) | INTRAMUSCULAR | Status: DC | PRN

## 2016-01-05 MED ORDER — LEVOFLOXACIN IN D5W 750 MG/150ML IV SOLN
750.0000 mg | INTRAVENOUS | Status: DC
  Administered 2016-01-06: 750 mg via INTRAVENOUS
  Filled 2016-01-05: qty 150

## 2016-01-05 MED ORDER — IPRATROPIUM BROMIDE 0.02 % IN SOLN
RESPIRATORY_TRACT | Status: AC
  Filled 2016-01-05: qty 2.5

## 2016-01-05 MED ORDER — INFLUENZA VAC SPLIT QUAD 0.5 ML IM SUSY
0.5000 mL | PREFILLED_SYRINGE | INTRAMUSCULAR | Status: AC
  Administered 2016-01-06: 0.5 mL via INTRAMUSCULAR
  Filled 2016-01-05: qty 0.5

## 2016-01-05 MED ORDER — GLIPIZIDE ER 5 MG PO TB24
10.0000 mg | ORAL_TABLET | Freq: Every evening | ORAL | Status: DC
  Administered 2016-01-05 – 2016-01-07 (×3): 10 mg via ORAL
  Filled 2016-01-05 (×3): qty 2

## 2016-01-05 MED ORDER — METHYLPREDNISOLONE SODIUM SUCC 40 MG IJ SOLR
40.0000 mg | Freq: Four times a day (QID) | INTRAMUSCULAR | Status: DC
  Administered 2016-01-05 – 2016-01-07 (×8): 40 mg via INTRAVENOUS
  Filled 2016-01-05 (×8): qty 1

## 2016-01-05 MED ORDER — METFORMIN HCL ER 500 MG PO TB24
1000.0000 mg | ORAL_TABLET | Freq: Two times a day (BID) | ORAL | Status: DC
  Administered 2016-01-05 – 2016-01-08 (×7): 1000 mg via ORAL
  Filled 2016-01-05 (×7): qty 2

## 2016-01-05 MED ORDER — ACETAMINOPHEN 650 MG RE SUPP: 650.0000 mg | Freq: Four times a day (QID) | RECTAL | Status: DC | PRN

## 2016-01-05 MED ORDER — PIOGLITAZONE HCL 15 MG PO TABS
15.0000 mg | ORAL_TABLET | Freq: Every day | ORAL | Status: DC
  Administered 2016-01-05 – 2016-01-08 (×4): 15 mg via ORAL
  Filled 2016-01-05 (×5): qty 1

## 2016-01-05 MED ORDER — ACETAMINOPHEN 325 MG PO TABS: 650.0000 mg | ORAL_TABLET | Freq: Four times a day (QID) | ORAL | Status: DC | PRN

## 2016-01-05 MED ORDER — LEVOFLOXACIN IN D5W 500 MG/100ML IV SOLN
500.0000 mg | Freq: Once | INTRAVENOUS | Status: AC
  Administered 2016-01-05: 500 mg via INTRAVENOUS
  Filled 2016-01-05: qty 100

## 2016-01-05 MED ORDER — VENLAFAXINE HCL ER 75 MG PO CP24
150.0000 mg | ORAL_CAPSULE | Freq: Every day | ORAL | Status: DC
  Administered 2016-01-06 – 2016-01-08 (×3): 150 mg via ORAL
  Filled 2016-01-05 (×3): qty 2

## 2016-01-05 MED ORDER — BENAZEPRIL HCL 10 MG PO TABS
40.0000 mg | ORAL_TABLET | Freq: Every morning | ORAL | Status: DC
  Administered 2016-01-05 – 2016-01-08 (×4): 40 mg via ORAL
  Filled 2016-01-05 (×4): qty 4

## 2016-01-05 MED ORDER — ONDANSETRON HCL 4 MG PO TABS
4.0000 mg | ORAL_TABLET | Freq: Four times a day (QID) | ORAL | Status: DC | PRN
  Administered 2016-01-07: 4 mg via ORAL
  Filled 2016-01-05: qty 1

## 2016-01-05 MED ORDER — NITROGLYCERIN IN D5W 200-5 MCG/ML-% IV SOLN
5.0000 ug/min | Freq: Once | INTRAVENOUS | Status: DC
  Filled 2016-01-05: qty 250

## 2016-01-05 MED ORDER — NICOTINE 21 MG/24HR TD PT24
21.0000 mg | MEDICATED_PATCH | Freq: Every day | TRANSDERMAL | Status: DC
  Administered 2016-01-05 – 2016-01-08 (×4): 21 mg via TRANSDERMAL
  Filled 2016-01-05 (×4): qty 1

## 2016-01-05 MED ORDER — FUROSEMIDE 10 MG/ML IJ SOLN
40.0000 mg | Freq: Every day | INTRAMUSCULAR | Status: DC
  Administered 2016-01-06: 40 mg via INTRAVENOUS
  Filled 2016-01-05: qty 4

## 2016-01-05 MED ORDER — ALBUTEROL SULFATE (2.5 MG/3ML) 0.083% IN NEBU
2.5000 mg | INHALATION_SOLUTION | Freq: Four times a day (QID) | RESPIRATORY_TRACT | Status: DC | PRN
  Administered 2016-01-05: 2.5 mg via RESPIRATORY_TRACT
  Filled 2016-01-05: qty 3

## 2016-01-05 MED ORDER — MAGNESIUM SULFATE 2 GM/50ML IV SOLN
2.0000 g | Freq: Once | INTRAVENOUS | Status: AC
  Administered 2016-01-05: 2 g via INTRAVENOUS
  Filled 2016-01-05: qty 50

## 2016-01-05 MED ORDER — OXYCODONE-ACETAMINOPHEN 5-325 MG PO TABS
1.5000 | ORAL_TABLET | ORAL | Status: DC | PRN
  Administered 2016-01-05 – 2016-01-07 (×7): 1.5 via ORAL
  Filled 2016-01-05 (×7): qty 2

## 2016-01-05 MED ORDER — ALPRAZOLAM 0.5 MG PO TABS
1.0000 mg | ORAL_TABLET | Freq: Three times a day (TID) | ORAL | Status: DC
  Administered 2016-01-05 – 2016-01-08 (×10): 1 mg via ORAL
  Filled 2016-01-05 (×10): qty 2

## 2016-01-05 MED ORDER — PANTOPRAZOLE SODIUM 40 MG PO TBEC
40.0000 mg | DELAYED_RELEASE_TABLET | Freq: Every day | ORAL | Status: DC
  Administered 2016-01-05 – 2016-01-08 (×4): 40 mg via ORAL
  Filled 2016-01-05 (×4): qty 1

## 2016-01-05 MED ORDER — ALBUTEROL (5 MG/ML) CONTINUOUS INHALATION SOLN
15.0000 mg/h | INHALATION_SOLUTION | Freq: Once | RESPIRATORY_TRACT | Status: AC
  Administered 2016-01-05: 15 mg/h via RESPIRATORY_TRACT
  Filled 2016-01-05: qty 20

## 2016-01-05 MED ORDER — INSULIN ASPART 100 UNIT/ML ~~LOC~~ SOLN
0.0000 [IU] | Freq: Three times a day (TID) | SUBCUTANEOUS | Status: DC
  Administered 2016-01-05 (×2): 15 [IU] via SUBCUTANEOUS
  Administered 2016-01-06: 7 [IU] via SUBCUTANEOUS
  Administered 2016-01-06 (×2): 15 [IU] via SUBCUTANEOUS
  Administered 2016-01-07: 11 [IU] via SUBCUTANEOUS

## 2016-01-05 MED ORDER — ENOXAPARIN SODIUM 40 MG/0.4ML ~~LOC~~ SOLN
40.0000 mg | SUBCUTANEOUS | Status: DC
Start: 2016-01-05 — End: 2016-01-08
  Administered 2016-01-05 – 2016-01-07 (×3): 40 mg via SUBCUTANEOUS
  Filled 2016-01-05 (×3): qty 0.4

## 2016-01-05 NOTE — ED Notes (Signed)
Per Dr Manus Gunning hold nitro drip

## 2016-01-05 NOTE — ED Notes (Signed)
Pt reports that he is feeling better, breathing more comfortable,

## 2016-01-05 NOTE — ED Notes (Signed)
At 19:11, lactic acid of 7.39 was entered in error on Mr Uhlir, Areatha Keas pt's primary RN in ICU and Dr Antionette Char notified,

## 2016-01-05 NOTE — H&P (Signed)
History and Physical    Arnette SchaumannKent W Seto NFA:[id number] DOB: 10-24-1950 DOA: 01/05/2016  PCP: Dwana MelenaZack Hall, MD   Patient coming from: Home   Chief Complaint: Shortness of breath   HPI: Arnette SchaumannKent W Traughber is a 65 y.o. male with medical history significant for asthma, COPD not on home oxygen, tobacco abuse of 1.5 ppd, DM type 2, and HTN, presents via EMS with complaints of shortness of breath that onset while waking up this morning. He states he took one albuterol with mild improvement. He has an associated cough and wheezing. He denies any chest pain, fever, or chills. In the ER, he was found to be quite hypertensive, in respiratory distress, and was placed on Bipap, given oxygen, IV steroids, nebs, and IV Lasix.  He diuresed and felt better.  Work up included CXR with pulmonary edema,  K of 3.1, WBC normal, and EKG showed ST with no acute ST T changes.   Hospitalist was asked to admit the patient for further evaluation of respiratory failure with hypoxia.   ED Course: Potassium 3.1, glucose 278, BNP 138, WBC 10.8   Review of Systems: As per HPI otherwise 10 point review of systems negative.    Past Medical History:  Diagnosis Date  . Asthma   . COPD (chronic obstructive pulmonary disease) (HCC)   . Diabetes mellitus without complication (HCC)   . Hypertension     Past Surgical History:  Procedure Laterality Date  . AMPUTATION  01/01/2012   Procedure: AMPUTATION DIGIT;  Surgeon: Tami RibasKevin R Kuzma, MD;  [facility];  Service: Orthopedics;  Laterality: Right;  Right Ring and small finger revision of amputation, I&D and pinning  . APPENDECTOMY    . BACK SURGERY    . CLOSED REDUCTION FINGER WITH PERCUTANEOUS PINNING  01/01/2012   Procedure: CLOSED REDUCTION FINGER WITH PERCUTANEOUS PINNING;  Surgeon: Tami RibasKevin R Kuzma, MD;  Location: [facility] OR;  Service: Orthopedics;  Laterality: Right;     reports that he has been smoking Cigarettes.  He has been smoking about 1.00 pack per day. He has never used smokeless  tobacco. He reports that he drinks about 6.0 oz of alcohol per week . He reports that he does not use drugs.  Allergies  Allergen Reactions  . Penicillins     Has patient had a PCN reaction causing immediate rash, facial/tongue/throat swelling, SOB or lightheadedness with hypotension:unknown Has patient had a PCN reaction causing severe rash involving mucus membranes or skin necrosis: unknown Has patient had a PCN reaction that required hospitalization; unknown Has patient had a PCN reaction occurring within the last 10 years; unknown If all of the above answers are "NO", then may proceed with Cephalosporin use.     History reviewed. No pertinent family history.   Prior to Admission medications   Medication Sig Start Date End Date Taking? Authorizing Provider  albuterol (PROAIR HFA) 108 (90 Base) MCG/ACT inhaler Inhale 1-2 puffs into the lungs every 6 (six) hours as needed for wheezing or shortness of breath.    Historical Provider, MD  ALPRAZolam Prudy Feeler(XANAX) 1 MG tablet Take 1 mg by mouth 3 (three) times daily.    Historical Provider, MD  aspirin EC 81 MG tablet Take 81 mg by mouth every morning.    Historical Provider, MD  benazepril (LOTENSIN) 40 MG tablet Take 40 mg by mouth every morning.     Historical Provider, MD  Dextromethorphan-Guaifenesin (MUCINEX DM MAXIMUM STRENGTH) 60-1200 MG TB12 Take 1 tablet by mouth every 12 (twelve) hours  as needed (for congestion).    Historical Provider, MD  glipiZIDE (GLUCOTROL XL) 10 MG 24 hr tablet Take 10 mg by mouth every evening.     Historical Provider, MD  ibuprofen (ADVIL,MOTRIN) 200 MG tablet Take 200 mg by mouth every 6 (six) hours as needed for mild pain or moderate pain.    Historical Provider, MD  metFORMIN (GLUCOPHAGE-XR) 500 MG 24 hr tablet Take 1,000 mg by mouth 2 (two) times daily.    Historical Provider, MD  omeprazole (PRILOSEC) 20 MG capsule Take 20 mg by mouth daily.    Historical Provider, MD  oxyCODONE-acetaminophen  (PERCOCET/ROXICET) 5-325 MG tablet Take 1 tablet by mouth every 4 (four) hours as needed for severe pain. Patient taking differently: Take 1.5 tablets by mouth every 4 (four) hours as needed for severe pain.  01/19/15   Glynn Octave, MD  pioglitazone (ACTOS) 15 MG tablet Take 15 mg by mouth daily.    Historical Provider, MD  predniSONE (DELTASONE) 20 MG tablet Take 2 tablets (40 mg total) by mouth daily with breakfast. For the next four days 11/11/15   Gerhard Munch, MD  rosuvastatin (CRESTOR) 5 MG tablet Take 5 mg by mouth every morning.    Historical Provider, MD  venlafaxine XR (EFFEXOR-XR) 150 MG 24 hr capsule Take 150 mg by mouth daily with breakfast.    Historical Provider, MD    Physical Exam: Vitals:   01/05/16 1135 01/05/16 1150 01/05/16 1154 01/05/16 1200  BP:    (!) 162/96  Pulse:   97 98  Resp:   16 (!) 24  Temp: 97.9 F (36.6 C)     TempSrc: Axillary     SpO2:  97% 97% 98%  Weight:      Height:          Constitutional: NAD, calm, comfortable Vitals:   01/05/16 1135 01/05/16 1150 01/05/16 1154 01/05/16 1200  BP:    (!) 162/96  Pulse:   97 98  Resp:   16 (!) 24  Temp: 97.9 F (36.6 C)     TempSrc: Axillary     SpO2:  97% 97% 98%  Weight:      Height:       Eyes: PERRL, lids and conjunctivae normal ENMT: Mucous membranes are moist. Posterior pharynx clear of any exudate or lesions.Normal dentition.  Neck: normal, supple, no masses, no thyromegaly Respiratory: wheezing bilaterally, no wheezing,bilateral crackles. Normal respiratory effort. No accessory muscle use.  Cardiovascular: Regular rate and rhythm, no murmurs / rubs / gallops. No extremity edema. 2+ pedal pulses. No carotid bruits.  Abdomen: no tenderness, no masses palpated. No hepatosplenomegaly. Bowel sounds positive.  Musculoskeletal: no clubbing / cyanosis. No joint deformity upper and lower extremities. Good ROM, no contractures. Normal muscle tone.  Skin: no rashes, lesions, ulcers. No  induration Neurologic: CN 2-12 grossly intact. Sensation intact, DTR normal. Strength 5/5 in all 4.  Psychiatric: Normal judgment and insight. Alert and oriented x 3. Normal mood.   Labs on Admission: I have personally reviewed following labs and imaging studies   Recent Labs Lab 01/05/16 0533  WBC 10.8*  NEUTROABS 7.6  HGB 16.5  HCT 47.7  MCV 97.5  PLT 264    Recent Labs Lab 01/05/16 0533  NA 139  K 3.1*  CL 105  CO2 24  GLUCOSE 278*  BUN 16  CREATININE 1.00  CALCIUM 7.9*    Recent Labs Lab 01/05/16 0533  AST 74*  ALT 59  ALKPHOS 83  BILITOT  0.4  PROT 7.3  ALBUMIN 4.0    Recent Labs Lab 01/05/16 0533  TROPONINI 0.03*       Component Value Date/Time   COLORURINE YELLOW 08/14/2006 1103   APPEARANCEUR CLEAR 08/14/2006 1103   LABSPEC 1.020 08/14/2006 1103   PHURINE 5.5 08/14/2006 1103   GLUCOSEU 250 (A) 08/14/2006 1103   HGBUR LARGE (A) 08/14/2006 1103   BILIRUBINUR NEGATIVE 08/14/2006 1103   KETONESUR NEGATIVE 08/14/2006 1103   PROTEINUR NEGATIVE 08/14/2006 1103   UROBILINOGEN 0.2 08/14/2006 1103   NITRITE NEGATIVE 08/14/2006 1103   LEUKOCYTESUR NEGATIVE 08/14/2006 1103    Radiological Exams on Admission: Dg Chest Portable 1 View  Result Date: 01/05/2016 CLINICAL DATA:  Shortness of breath. EXAM: PORTABLE CHEST 1 VIEW COMPARISON:  11/11/2015 FINDINGS: Mild cardiac enlargement and pulmonary vascular congestion. Interstitial changes in the lungs suggesting edema. Superimposed focal consolidation in the right lung base may represent superimposed pneumonia or asymmetrical edema. No blunting of costophrenic angles. No pneumothorax. IMPRESSION: Cardiac enlargement with mild vascular congestion and diffuse interstitial edema. Asymmetrical edema versus superimposed consolidation in the right lung base. Electronically Signed   By: Burman Nieves M.D.   On: 01/05/2016 06:09    EKG: Independently reviewed. EKG shows sinus tachycardia.    Assessment/Plan Principal Problem:   Respiratory failure with hypoxia (HCC) Active Problems:   Pulmonary edema   COPD with acute exacerbation (HCC)   HTN (hypertension)   DM (diabetes mellitus) (HCC)   Respiratory failure (HCC)  1. Respiratory failure with hypoxia. While in route he was given solumedrol and started on BiPAP. Will continue supplemental oxygen, wean as tolerated.  2. COPD exacerbation. Patient is not on home oxygen. Continue Levaquin, albuterol, IV steroids, and nebs. Will wean Bipap as tolerated.  3. Congestive heart failure. Will obtain an echocardiogram. Continue to monitor. Continue with IV Lasix.  4. Borderline troponin of 0.03.  No evidence of ischemia or ACS.   5. Hypokalemia. Replace potassium.  6. Leukocytosis. White blood cell count of 10.8 on admission. Continue to monitor for now.  7. HTN. Pressures are mildly elevated. Continue metformin and benazepril for now.  8. DM type 2. Blood sugars are elevated. I will continue with his oral glycemic drugs.  Add resistant scale SSI given IV Steroids given.  Carb modified diet when he is off Bipap.  9. Tobacco abuse. Counseled on the importance of cessation. Nicotine offered and declined.  I suspect he will continue to smoke.   DVT prophylaxis:  SQ heparin  Code Status: Full  Family Communication: Family bedside Disposition Plan: Discharge home once improved.  Consults called: None  Admission status: Inpatient   Houston Siren, MD FACP [facility] If 7PM-7AM, please contact night-coverage [email or web address] Password [email or web address]  01/05/2016, 12:29 PM   By signing my name below, I, Cynda Acres, attest that this documentation has been prepared under the direction and in the presence of Houston Siren, MD. Electronically signed: Cynda Acres, Scribe. 01/05/16 7:55 AM

## 2016-01-05 NOTE — ED Notes (Signed)
CRITICAL VALUE ALERT  Critical value received:  Trop 0.03  Date of notification:  01/05/2016  Time of notification:  06:37  Critical value read back: yes  Nurse who received alert:  Juliette Alcide RN   MD notified (1st Elbe): Dr Manus Gunning   Time of first Weikel:  036:37  MD notified (2nd Armijo):  Time of second Flori:  Responding MD:  Dr Manus Gunning  Time MD responded:  06:37

## 2016-01-05 NOTE — ED Triage Notes (Signed)
EMS reports pt was outside on porch upon arrival- c/o SOB. Pt had one albuterol neb tx at home prior to EMS arriving. EMS administered 125 mg solumedrol in route.

## 2016-01-05 NOTE — ED Provider Notes (Signed)
[facility]-EMER[facility]Y DEPT Provider Note   CSN: [id number] Arrival date & time: 01/05/16  [id number]     History   Chief Complaint Chief Complaint  Patient presents with  . Shortness of Breath    HPI Cody [name] is a 65 y.o. male.  Level V caveat for acuity of condition. Patient is in severe respiratory distress. States he woke up short of breath this morning and took one albuterol at home prior to calling EMS. Felt fine when he went to bed last night. Complains of severe shortness of breath, coughing and wheezing. Does have history of COPD, does not or oxygen at home. Receive Solu-Medrol by EMS but no bronchodilators because he was tachycardic. He denies chest pain. He says a cough productive revealed mucus. His daughter reports no history of heart failure or heart attack. No leg pain or leg swelling. No history of blood clot. He's never been intubated.   The history is provided by the patient, the EMS personnel and a relative. The history is limited by the condition of the patient.  Shortness of Breath     Past Medical History:  Diagnosis Date  . Asthma   . COPD (chronic obstructive pulmonary disease) (HCC)   . Diabetes mellitus without complication (HCC)   . Hypertension     There are no active problems to display for this patient.   Past Surgical History:  Procedure Laterality Date  . AMPUTATION  01/01/2012   Procedure: AMPUTATION DIGIT;  Surgeon: Tami RibasKevin R Kuzma, MD;  [facility];  Service: Orthopedics;  Laterality: Right;  Right Ring and small finger revision of amputation, I&D and pinning  . APPENDECTOMY    . BACK SURGERY    . CLOSED REDUCTION FINGER WITH PERCUTANEOUS PINNING  01/01/2012   Procedure: CLOSED REDUCTION FINGER WITH PERCUTANEOUS PINNING;  Surgeon: Tami RibasKevin R Kuzma, MD;  Location: [facility] OR;  Service: Orthopedics;  Laterality: Right;       Home Medications    Prior to Admission medications   Medication Sig Start Date End Date Taking? Authorizing Provider    albuterol (PROAIR HFA) 108 (90 Base) MCG/ACT inhaler Inhale 1-2 puffs into the lungs every 6 (six) hours as needed for wheezing or shortness of breath.    Historical Provider, MD  ALPRAZolam Prudy Feeler(XANAX) 1 MG tablet Take 1 mg by mouth 3 (three) times daily.    Historical Provider, MD  aspirin EC 81 MG tablet Take 81 mg by mouth every morning.    Historical Provider, MD  benazepril (LOTENSIN) 40 MG tablet Take 40 mg by mouth every morning.     Historical Provider, MD  Dextromethorphan-Guaifenesin (MUCINEX DM MAXIMUM STRENGTH) 60-1200 MG TB12 Take 1 tablet by mouth every 12 (twelve) hours as needed (for congestion).    Historical Provider, MD  glipiZIDE (GLUCOTROL XL) 10 MG 24 hr tablet Take 10 mg by mouth every evening.     Historical Provider, MD  ibuprofen (ADVIL,MOTRIN) 200 MG tablet Take 200 mg by mouth every 6 (six) hours as needed for mild pain or moderate pain.    Historical Provider, MD  metFORMIN (GLUCOPHAGE-XR) 500 MG 24 hr tablet Take 1,000 mg by mouth 2 (two) times daily.    Historical Provider, MD  omeprazole (PRILOSEC) 20 MG capsule Take 20 mg by mouth daily.    Historical Provider, MD  oxyCODONE-acetaminophen (PERCOCET/ROXICET) 5-325 MG tablet Take 1 tablet by mouth every 4 (four) hours as needed for severe pain. Patient taking differently: Take 1.5 tablets by mouth every 4 (four)  hours as needed for severe pain.  01/19/15   Glynn Octave, MD  pioglitazone (ACTOS) 15 MG tablet Take 15 mg by mouth daily.    Historical Provider, MD  predniSONE (DELTASONE) 20 MG tablet Take 2 tablets (40 mg total) by mouth daily with breakfast. For the next four days 11/11/15   Gerhard Munch, MD  rosuvastatin (CRESTOR) 5 MG tablet Take 5 mg by mouth every morning.    Historical Provider, MD  venlafaxine XR (EFFEXOR-XR) 150 MG 24 hr capsule Take 150 mg by mouth daily with breakfast.    Historical Provider, MD    Family History No family history on file.  Social History Social History  Substance Use  Topics  . Smoking status: Current Every Day Smoker    Packs/day: 1.00    Types: Cigarettes  . Smokeless tobacco: Never Used  . Alcohol use 6.0 oz/week    10 Cans of beer per week     Comment: week     Allergies   Penicillins   Review of Systems Review of Systems  Unable to perform ROS: Severe respiratory distress  Respiratory: Positive for shortness of breath.      Physical Exam Updated Vital Signs BP (!) 200/112 (BP Location: Right Arm)   Pulse 118   Temp (!) 96 F (35.6 C) (Tympanic)   Resp (!) 32   Ht 5\' 9"  (1.753 m)   Wt 180 lb (81.6 kg)   SpO2 100%   BMI 26.58 kg/m   Physical Exam  Constitutional: He appears distressed.  HENT:  Head: Normocephalic and atraumatic.  Right Ear: External ear normal.  Left Ear: External ear normal.  Mouth/Throat: Oropharynx is clear and moist.  Eyes: Conjunctivae and EOM are normal. Pupils are equal, round, and reactive to light.  Neck: Normal range of motion.  Cardiovascular:  tachycardic  Pulmonary/Chest: He is in respiratory distress. He has wheezes.  Speaking 2-3 words at a time Accessory muscle use. Poor air exchange, scattered wheezing  Abdominal: Soft. Bowel sounds are normal. There is no tenderness.  Musculoskeletal: Normal range of motion. He exhibits no edema, tenderness or deformity.  Neurological: He is alert. No cranial nerve deficit.  Skin: Skin is warm. Capillary refill takes less than 2 seconds. He is diaphoretic.     ED Treatments / Results  Labs (all labs ordered are listed, but only abnormal results are displayed) Labs Reviewed  CBC WITH DIFFERENTIAL/PLATELET - Abnormal; Notable for the following:       Result Value   WBC 10.8 (*)    All other components within normal limits  COMPREHENSIVE METABOLIC PANEL - Abnormal; Notable for the following:    Potassium 3.1 (*)    Glucose, Bld 278 (*)    Calcium 7.9 (*)    AST 74 (*)    All other components within normal limits  TROPONIN I - Abnormal;  Notable for the following:    Troponin I 0.03 (*)    All other components within normal limits  BRAIN NATRIURETIC PEPTIDE - Abnormal; Notable for the following:    B Natriuretic Peptide 138.0 (*)    All other components within normal limits  BLOOD GAS, ARTERIAL - Abnormal; Notable for the following:    pH, Arterial 7.335 (*)    All other components within normal limits  CULTURE, BLOOD (ROUTINE X 2)  CULTURE, BLOOD (ROUTINE X 2)  I-STAT CG4 LACTIC ACID, ED  I-STAT ARTERIAL BLOOD GAS, ED  I-STAT CG4 LACTIC ACID, ED    EKG  EKG Interpretation None       Radiology Dg Chest Portable 1 View  Result Date: 01/05/2016 CLINICAL DATA:  Shortness of breath. EXAM: PORTABLE CHEST 1 VIEW COMPARISON:  11/11/2015 FINDINGS: Mild cardiac enlargement and pulmonary vascular congestion. Interstitial changes in the lungs suggesting edema. Superimposed focal consolidation in the right lung base may represent superimposed pneumonia or asymmetrical edema. No blunting of costophrenic angles. No pneumothorax. IMPRESSION: Cardiac enlargement with mild vascular congestion and diffuse interstitial edema. Asymmetrical edema versus superimposed consolidation in the right lung base. Electronically Signed   By: Burman Nieves M.D.   On: 01/05/2016 06:09    Procedures Procedures (including critical care time)  Medications Ordered in ED Medications  albuterol (PROVENTIL,VENTOLIN) solution continuous neb (not administered)  ipratropium (ATROVENT) nebulizer solution 0.5 mg (not administered)  magnesium sulfate IVPB 2 g 50 mL (not administered)     Initial Impression / Assessment and Plan / ED Course  I have reviewed the triage vital signs and the nursing notes.  Pertinent labs & imaging results that were available during my care of the patient were reviewed by me and considered in my medical decision making (see chart for details).  Clinical Course    Patient with severe shortness of breath likely due to  COPD exacerbation. He is placed on BiPAP on arrival. Given bronchodilators, steroids and magnesium.  Patient improving on BiPAP. EKG shows ST depression laterally which is more pronounced. Denies chest pain. X-ray consistent with mild edema as well as probable right basilar infiltrate. Treating for COPD exacerbation as well as pneumonia. Possible component of heart failure as well IV Lasix given with IV antibiotics.  ABG is reassuring without CO2 retention. PH is normal. Patient is now calm and comfortable on BiPAP moving air well.  NTG on hold as BP improved.  No chest pain. Troponin minimally elevated.  Comfortable on bipap, moving air well.  Admission to ICU d/w Dr Conley Rolls.   CRITICAL CARE Performed by: Glynn Octave Total critical care time: 45 minutes Critical care time was exclusive of separately billable procedures and treating other patients. Critical care was necessary to treat or prevent imminent or life-threatening deterioration. Critical care was time spent personally by me on the following activities: development of treatment plan with patient and/or surrogate as well as nursing, discussions with consultants, evaluation of patient's response to treatment, examination of patient, obtaining history from patient or surrogate, ordering and performing treatments and interventions, ordering and review of laboratory studies, ordering and review of radiographic studies, pulse oximetry and re-evaluation of patient's condition.  Final Clinical Impressions(s) / ED Diagnoses   Final diagnoses:  COPD exacerbation (HCC)  Community acquired pneumonia of right lower lobe of lung (HCC)  Acute respiratory failure with hypoxia [facility])    New Prescriptions New Prescriptions   No medications on file     Glynn Octave, MD 01/05/16 [phone]

## 2016-01-06 ENCOUNTER — Inpatient Hospital Stay (HOSPITAL_COMMUNITY): Payer: Medicare HMO

## 2016-01-06 DIAGNOSIS — J441 Chronic obstructive pulmonary disease with (acute) exacerbation: Secondary | ICD-10-CM

## 2016-01-06 DIAGNOSIS — R079 Chest pain, unspecified: Secondary | ICD-10-CM

## 2016-01-06 LAB — TROPONIN I
TROPONIN I: 0.03 ng/mL — AB (ref <0.03)
Troponin I: 0.05 ng/mL (ref <0.03)

## 2016-01-06 LAB — GLUCOSE, CAPILLARY
GLUCOSE-CAPILLARY: 309 mg/dL — AB (ref 65–99)
Glucose-Capillary: 340 mg/dL — ABNORMAL HIGH (ref 65–99)
Glucose-Capillary: 230 mg/dL — ABNORMAL HIGH (ref 65–99)
Glucose-Capillary: 369 mg/dL — ABNORMAL HIGH (ref 65–99)

## 2016-01-06 LAB — CBC
HCT: 40.6 % (ref 39.0–52.0)
HEMOGLOBIN: 13.7 g/dL (ref 13.0–17.0)
MCH: 32.8 pg (ref 26.0–34.0)
MCHC: 33.7 g/dL (ref 30.0–36.0)
MCV: 97.1 fL (ref 78.0–100.0)
Platelets: 241 10*3/uL (ref 150–400)
RBC: 4.18 MIL/uL — AB (ref 4.22–5.81)
RDW: 13.2 % (ref 11.5–15.5)
WBC: 7.8 10*3/uL (ref 4.0–10.5)

## 2016-01-06 LAB — ECHOCARDIOGRAM COMPLETE
Height: 68 in
Weight: 2828.94 oz

## 2016-01-06 LAB — BASIC METABOLIC PANEL
ANION GAP: 12 (ref 5–15)
BUN: 26 mg/dL — ABNORMAL HIGH (ref 6–20)
CALCIUM: 8.7 mg/dL — AB (ref 8.9–10.3)
CHLORIDE: 102 mmol/L (ref 101–111)
CO2: 24 mmol/L (ref 22–32)
Creatinine, Ser: 1.05 mg/dL (ref 0.61–1.24)
GFR calc non Af Amer: >60 mL/min (ref >60)
Glucose, Bld: 283 mg/dL — ABNORMAL HIGH (ref 65–99)
Potassium: 3.2 mmol/L — ABNORMAL LOW (ref 3.5–5.1)
SODIUM: 138 mmol/L (ref 135–145)

## 2016-01-06 MED ORDER — ZOLPIDEM TARTRATE 5 MG PO TABS
5.0000 mg | ORAL_TABLET | Freq: Every evening | ORAL | Status: DC | PRN
  Administered 2016-01-06 – 2016-01-07 (×2): 5 mg via ORAL
  Filled 2016-01-06 (×2): qty 1

## 2016-01-06 MED ORDER — METOPROLOL TARTRATE 25 MG PO TABS
25.0000 mg | ORAL_TABLET | Freq: Two times a day (BID) | ORAL | Status: DC
  Administered 2016-01-06: 25 mg via ORAL
  Filled 2016-01-06: qty 1

## 2016-01-06 MED ORDER — METOPROLOL TARTRATE 25 MG PO TABS
25.0000 mg | ORAL_TABLET | Freq: Once | ORAL | Status: AC
  Administered 2016-01-06: 25 mg via ORAL
  Filled 2016-01-06: qty 1

## 2016-01-06 MED ORDER — POTASSIUM CHLORIDE CRYS ER 20 MEQ PO TBCR
40.0000 meq | EXTENDED_RELEASE_TABLET | Freq: Every day | ORAL | Status: DC
  Administered 2016-01-06 – 2016-01-08 (×3): 40 meq via ORAL
  Filled 2016-01-06 (×3): qty 2

## 2016-01-06 MED ORDER — ZOLPIDEM TARTRATE 5 MG PO TABS
5.0000 mg | ORAL_TABLET | Freq: Once | ORAL | Status: AC
Start: 2016-01-06 — End: 2016-01-06
  Administered 2016-01-06: 5 mg via ORAL
  Filled 2016-01-06: qty 1

## 2016-01-06 MED ORDER — LEVOFLOXACIN 750 MG PO TABS
750.0000 mg | ORAL_TABLET | Freq: Every day | ORAL | Status: DC
  Administered 2016-01-07 – 2016-01-08 (×2): 750 mg via ORAL
  Filled 2016-01-06 (×2): qty 1

## 2016-01-06 MED ORDER — LEVALBUTEROL HCL 0.63 MG/3ML IN NEBU: 0.6300 mg | INHALATION_SOLUTION | Freq: Four times a day (QID) | RESPIRATORY_TRACT | Status: DC | PRN

## 2016-01-06 NOTE — Progress Notes (Signed)
  Echocardiogram 2D Echocardiogram has been performed.  Edna Grover L Androw 01/06/2016, 9:22 AM

## 2016-01-06 NOTE — Progress Notes (Signed)
PROGRESS NOTE    Cody [name]  ZOX:[id number] DOB: 18-Oct-1950 DOA: 01/05/2016 PCP: Dwana Melena, MD    Brief Narrative: 65 yo with tobacco abuse, COPD, HTN, DM, admitted for acute on chronic respiratory failure (COPD exacerbation), CHF, and was given IV Lasix, steroids, nebs, and antibiotics.  He is feeling better today.  He was noted tachycardic this am.  He doesn't drink alcohol, but has been on chronic benzo at home.    Assessment & Plan:   Principal Problem:   Respiratory failure with hypoxia (HCC) Active Problems:   Pulmonary edema   COPD with acute exacerbation (HCC)   HTN (hypertension)   DM (diabetes mellitus) (HCC)   Respiratory failure (HCC)  1. Respiratory failure with hypoxia. While in route he was given solumedrol and started on BiPAP. Will continue supplemental oxygen, wean as tolerated.  2. COPD exacerbation. Patient is not on home oxygen. Continue Levaquin, albuterol, IV steroids, and nebs. Now off bipap.  Will change neb to Xopenex due to tachycardia.   3. Congestive heart failure. Will obtain an echocardiogram. Continue to monitor. Continue with IV Lasix.  4. Borderline troponin of 0.03.  No evidence of ischemia or ACS.  Repeat negative.  5. Hypokalemia. Replace potassium.  6. Leukocytosis. White blood cell count of 10.8 on admission. Continue to monitor for now.  7. HTN. Pressures are mildly elevated. Continue metformin and benazepril for now.  8. DM type 2. Blood sugars are elevated. I will continue with his oral glycemic drugs.  Add resistant scale SSI given IV Steroids given.  Carb modified diet when he is off Bipap.  9. Tobacco abuse. Counseled on the importance of cessation. Nicotine offered and declined.  I suspect he will continue to smoke.   DVT prophylaxis: SQ Heparin Code Status: FULL CODE.  Family Communication:  None today. Disposition Plan: Home when better.   Consultants:   None.   Procedures:   None.   Antimicrobials: Anti-infectives    Start     Dose/Rate Route Frequency Ordered Stop   01/06/16 0600  levofloxacin (LEVAQUIN) IVPB 750 mg     750 mg 100 mL/hr over 90 Minutes Intravenous Every 24 hours 01/05/16 0923     01/05/16 0630  levofloxacin (LEVAQUIN) IVPB 500 mg     500 mg 100 mL/hr over 60 Minutes Intravenous  Once 01/05/16 0615 01/05/16 0817       Subjective: Feeling better, wanting to go home.   Objective: Vitals:   01/06/16 0500 01/06/16 0600 01/06/16 0700 01/06/16 0800  BP: (!) 156/96 (!) 158/99 (!) 158/101   Pulse: (!) 105 (!) 102 (!) 109   Resp: 16 16 13    Temp:    98.4 F (36.9 C)  TempSrc:    Oral  SpO2: 96% 96% 97%   Weight: 80.2 kg (176 lb 12.9 oz)     Height:        Intake/Output Summary (Last 24 hours) at 01/06/16 0834 Last data filed at 01/06/16 0723  Gross per 24 hour  Intake              720 ml  Output             1700 ml  Net             -980 ml   Filed Weights   01/05/16 0520 01/05/16 0923 01/06/16 0500  Weight: 81.6 kg (180 lb) 79.3 kg (174 lb 13.2 oz) 80.2 kg (176 lb 12.9 oz)    Examination:  General exam: Appears calm and comfortable  Respiratory system: Clear to auscultation. Respiratory effort normal. Cardiovascular system: S1 & S2 heard, RRR. No JVD, murmurs, rubs, gallops or clicks. No pedal edema. Gastrointestinal system: Abdomen is nondistended, soft and nontender. No organomegaly or masses felt. Normal bowel sounds heard. Central nervous system: Alert and oriented. No focal neurological deficits. Extremities: Symmetric 5 x 5 power. Skin: No rashes, lesions or ulcers Psychiatry: Judgement and insight appear normal. Mood & affect appropriate.   Data Reviewed: I have personally reviewed following labs and imaging studies  CBC:  Recent Labs Lab 01/05/16 0533 01/06/16 0451  WBC 10.8* 7.8  NEUTROABS 7.6  --   HGB 16.5 13.7  HCT 47.7 40.6  MCV 97.5 97.1  PLT 264 241   Basic Metabolic Panel:  Recent Labs Lab 01/05/16 0533 01/06/16 0451  NA 139 138  K  3.1* 3.2*  CL 105 102  CO2 24 24  GLUCOSE 278* 283*  BUN 16 26*  CREATININE 1.00 1.05  CALCIUM 7.9* 8.7*   GFR: Estimated Creatinine Clearance: 68.8 mL/min (by C-G formula based on SCr of 1.05 mg/dL). Liver Function Tests:  Recent Labs Lab 01/05/16 0533  AST 74*  ALT 59  ALKPHOS 83  BILITOT 0.4  PROT 7.3  ALBUMIN 4.0     Recent Labs Lab 01/05/16 0533  TROPONINI 0.03*   BNP  CBG:  Recent Labs Lab 01/05/16 1113 01/05/16 1603 01/05/16 2044 01/06/16 0000 01/06/16 0718  GLUCAP 307* 300* 299* 250* 340*   Lipid Profile: No results for input(s): CHOL, HDL, LDLCALC, TRIG, CHOLHDL, LDLDIRECT in the last 72 hours. Thyroid Function Tests:  Recent Labs  01/05/16 0533  TSH 0.885   Sepsis Labs:  Recent Labs Lab 01/05/16 0709 01/05/16 1911  LATICACIDVEN 1.71 7.39*    Recent Results (from the past 240 hour(s))  Blood culture (routine x 2)     Status: None (Preliminary result)   Collection Time: 01/05/16  6:52 AM  Result Value Ref Range Status   Specimen Description RIGHT ANTECUBITAL  Final   Special Requests BOTTLES DRAWN AEROBIC AND ANAEROBIC 6CC EACH  Final   Culture NO GROWTH < 12 HOURS  Final   Report Status PENDING  Incomplete  Blood culture (routine x 2)     Status: None (Preliminary result)   Collection Time: 01/05/16  7:02 AM  Result Value Ref Range Status   Specimen Description BLOOD RIGHT HAND  Final   Special Requests BOTTLES DRAWN AEROBIC ONLY 6CC  Final   Culture NO GROWTH < 12 HOURS  Final   Report Status PENDING  Incomplete  MRSA PCR Screening     Status: None   Collection Time: 01/05/16  9:28 AM  Result Value Ref Range Status   MRSA by PCR NEGATIVE NEGATIVE Final    Comment:        The GeneXpert MRSA Assay (FDA approved for NASAL specimens only), is one component of a comprehensive MRSA colonization surveillance program. It is not intended to diagnose MRSA infection nor to guide or monitor treatment for MRSA infections.       Radiology Studies: Dg Chest Portable 1 View  Result Date: 01/05/2016 CLINICAL DATA:  Shortness of breath. EXAM: PORTABLE CHEST 1 VIEW COMPARISON:  11/11/2015 FINDINGS: Mild cardiac enlargement and pulmonary vascular congestion. Interstitial changes in the lungs suggesting edema. Superimposed focal consolidation in the right lung base may represent superimposed pneumonia or asymmetrical edema. No blunting of costophrenic angles. No pneumothorax. IMPRESSION: Cardiac enlargement with mild vascular  congestion and diffuse interstitial edema. Asymmetrical edema versus superimposed consolidation in the right lung base. Electronically Signed   By: Burman Nieves M.D.   On: 01/05/2016 06:09    Scheduled Meds: . ALPRAZolam  1 mg Oral TID  . aspirin EC  81 mg Oral q morning - 10a  . benazepril  40 mg Oral q morning - 10a  . enoxaparin (LOVENOX) injection  40 mg Subcutaneous Q24H  . furosemide  40 mg Intravenous Daily  . glipiZIDE  10 mg Oral QPM  . Influenza vac split quadrivalent PF  0.5 mL Intramuscular Tomorrow-1000  . insulin aspart  0-20 Units Subcutaneous TID WC  . insulin aspart  0-5 Units Subcutaneous QHS  . levofloxacin (LEVAQUIN) IV  750 mg Intravenous Q24H  . metFORMIN  1,000 mg Oral BID WC  . methylPREDNISolone (SOLU-MEDROL) injection  40 mg Intravenous Q6H  . nicotine  21 mg Transdermal Daily  . pantoprazole  40 mg Oral Daily  . pioglitazone  15 mg Oral Daily  . potassium chloride  40 mEq Oral Daily  . rosuvastatin  5 mg Oral q morning - 10a  . venlafaxine XR  150 mg Oral Q breakfast   Continuous Infusions:   LOS: 1 day   Damiana Berrian, MD FACP Hospitalist.   If 7PM-7AM, please contact night-coverage [email or web address] Password [facility] 01/06/2016, 8:34 AM

## 2016-01-06 NOTE — Progress Notes (Signed)
PHARMACIST - PHYSICIAN COMMUNICATION DR:    CONCERNING: Antibiotic IV to Oral Route Change Policy  RECOMMENDATION: This patient is receiving Levaquin by the intravenous route.  Based on criteria approved by the Pharmacy and Therapeutics Committee, the antibiotic(s) is/are being converted to the equivalent oral dose form(s).   DESCRIPTION: These criteria include:  Patient being treated for a respiratory tract infection, urinary tract infection, cellulitis or clostridium difficile associated diarrhea if on metronidazole  The patient is not neutropenic and does not exhibit a GI malabsorption state  The patient is eating (either orally or via tube) and/or has been taking other orally administered medications for a least 24 hours  The patient is improving clinically and has a Tmax < 100.5  If you have questions about this conversion, please contact the Pharmacy Department  [x]  ( [phone] )  [place] []  ( [phone] )  [facility] []  ( [phone]  [place] []  ( [phone] )  [facility] []  ( [phone] )   [facility]  

## 2016-01-07 LAB — CG4 I-STAT (LACTIC ACID): Lactic Acid, Venous: 7.39 mmol/L (ref 0.5–1.9)

## 2016-01-07 LAB — GLUCOSE, CAPILLARY
GLUCOSE-CAPILLARY: 228 mg/dL — AB (ref 65–99)
Glucose-Capillary: 268 mg/dL — ABNORMAL HIGH (ref 65–99)
Glucose-Capillary: 240 mg/dL — ABNORMAL HIGH (ref 65–99)
Glucose-Capillary: 151 mg/dL — ABNORMAL HIGH (ref 65–99)

## 2016-01-07 LAB — TROPONIN I: TROPONIN I: 0.07 ng/mL — AB (ref <0.03)

## 2016-01-07 MED ORDER — PREDNISONE 20 MG PO TABS
40.0000 mg | ORAL_TABLET | Freq: Every day | ORAL | Status: DC
  Administered 2016-01-07 – 2016-01-08 (×2): 40 mg via ORAL
  Filled 2016-01-07 (×2): qty 2

## 2016-01-07 MED ORDER — INSULIN ASPART 100 UNIT/ML ~~LOC~~ SOLN
0.0000 [IU] | Freq: Every day | SUBCUTANEOUS | Status: DC
  Administered 2016-01-07: 2 [IU] via SUBCUTANEOUS

## 2016-01-07 MED ORDER — INSULIN ASPART 100 UNIT/ML ~~LOC~~ SOLN
0.0000 [IU] | Freq: Three times a day (TID) | SUBCUTANEOUS | Status: DC
  Administered 2016-01-07: 4 [IU] via SUBCUTANEOUS
  Administered 2016-01-07: 7 [IU] via SUBCUTANEOUS

## 2016-01-07 MED ORDER — HYDROCHLOROTHIAZIDE 25 MG PO TABS
25.0000 mg | ORAL_TABLET | Freq: Every day | ORAL | Status: DC
  Administered 2016-01-07 – 2016-01-08 (×2): 25 mg via ORAL
  Filled 2016-01-07 (×2): qty 1

## 2016-01-07 MED ORDER — INSULIN ASPART 100 UNIT/ML ~~LOC~~ SOLN
4.0000 [IU] | Freq: Three times a day (TID) | SUBCUTANEOUS | Status: DC
  Administered 2016-01-07 – 2016-01-08 (×3): 4 [IU] via SUBCUTANEOUS

## 2016-01-07 MED ORDER — METOPROLOL TARTRATE 50 MG PO TABS
50.0000 mg | ORAL_TABLET | Freq: Two times a day (BID) | ORAL | Status: DC
  Administered 2016-01-07 – 2016-01-08 (×3): 50 mg via ORAL
  Filled 2016-01-07 (×3): qty 1

## 2016-01-07 MED ORDER — INSULIN GLARGINE 100 UNIT/ML ~~LOC~~ SOLN
10.0000 [IU] | Freq: Every day | SUBCUTANEOUS | Status: DC
  Administered 2016-01-07 – 2016-01-08 (×2): 10 [IU] via SUBCUTANEOUS
  Filled 2016-01-07 (×4): qty 0.1

## 2016-01-07 NOTE — Progress Notes (Signed)
PROGRESS NOTE    Cody [name]  ION:[id number] DOB: January 29, 1951 DOA: 01/05/2016 PCP: Dwana Melena, MD    Brief Narrative: 65 yo with tobacco abuse, COPD, HTN, DM, admitted for acute on chronic respiratory failure (COPD exacerbation), CHF, and was given IV Lasix, steroids, nebs, and antibiotics.  He is feeling better today.  He was noted tachycardic this am.  He doesn't drink alcohol, but has been on chronic benzo at home.  His neb was changed to Xopenex, and he was started on BB, with improvement of his symptoms.  ECHO showed global hypokinesis with EF 45%.  Troponins were only slightly elevated.  His BP has been elevated as well.    Assessment & Plan:   Principal Problem:   Respiratory failure with hypoxia (HCC) Active Problems:   Pulmonary edema   COPD with acute exacerbation (HCC)   HTN (hypertension)   DM (diabetes mellitus) (HCC)   Respiratory failure (HCC)   1. Respiratory failure with hypoxia. While in route he was given solumedrol and started on BiPAP. Will continue supplemental oxygen, wean as tolerated. He is now on [place] and is doing well.  2. COPD exacerbation. Patient is not on home oxygen. Will wean oxygen. Continue Levaquin, albuterol, change steroids to Prednisone.    3. Congestive heart failure. EF 45% with diffuse hypokinesis.  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  Consider stress test at some point in the future.  Continue to monitor. Already on Benazepril, will increase BB for BP control. 4. Borderline troponin peaked at 0.07. No evidence of ischemia or ACS. Repeat negative.  5. Hypokalemia. Replace potassium.  6. Leukocytosis. White blood cell count of 10.8 on admission. Continue to monitor for now.  7. HTN. Pressures are mildly elevated. Continue metformin and benazepril for now. BB added and uptitrate.  8. DM type 2. Blood sugars are elevated. I will continue with his oral glycemic drugs. Add resistant scale SSI given IV Steroids given. Carb modified diet when he is off Bipap.    9. Tobacco abuse. Counseled on the importance of cessation. Nicotine offered and declined. I suspect he will continue to smoke.   DVT prophylaxis: SQ Heparin.  Code Status: FULL CODE.  Family Communication:  Daughter updated yesterday.  Disposition Plan: Home. Probably tomorrow.   Consultants: None.   Procedures:   ECHO.   Antimicrobials: Anti-infectives    Start     Dose/Rate Route Frequency Ordered Stop   01/07/16 0800  levofloxacin (LEVAQUIN) tablet 750 mg     750 mg Oral Daily 01/06/16 1333     01/06/16 0600  levofloxacin (LEVAQUIN) IVPB 750 mg  Status:  Discontinued     750 mg 100 mL/hr over 90 Minutes Intravenous Every 24 hours 01/05/16 0923 01/06/16 1333   01/05/16 0630  levofloxacin (LEVAQUIN) IVPB 500 mg     500 mg 100 mL/hr over 60 Minutes Intravenous  Once 01/05/16 0615 01/05/16 0817       Subjective:   Feeling better.   No complaints.  No chest pain.   Objective: Vitals:   01/07/16 0300 01/07/16 0400 01/07/16 0459 01/07/16 0830  BP: 138/87     Pulse: 72     Resp: 14     Temp:  98 F (36.7 C)  98 F (36.7 C)  TempSrc:  Oral  Oral  SpO2: 97%     Weight:   80.9 kg (178 lb 5.6 oz)   Height:        Intake/Output Summary (Last 24 hours) at  01/07/16 0849 Last data filed at 01/07/16 0831  Gross per 24 hour  Intake                0 ml  Output             2575 ml  Net            -2575 ml   Filed Weights   01/05/16 0923 01/06/16 0500 01/07/16 0459  Weight: 79.3 kg (174 lb 13.2 oz) 80.2 kg (176 lb 12.9 oz) 80.9 kg (178 lb 5.6 oz)    Examination:  General exam: Appears calm and comfortable  Respiratory system: Clear to auscultation. Respiratory effort normal. Cardiovascular system: S1 & S2 heard, RRR. No JVD, murmurs, rubs, gallops or clicks. No pedal edema. Gastrointestinal system: Abdomen is nondistended, soft and nontender. No organomegaly or masses felt. Normal bowel sounds heard. Central nervous system: Alert and oriented. No focal neurological  deficits. Extremities: Symmetric 5 x 5 power. Skin: No rashes, lesions or ulcers Psychiatry: Judgement and insight appear normal. Mood & affect appropriate.   Data Reviewed: I have personally reviewed following labs and imaging studies  CBC:  Recent Labs Lab 01/05/16 0533 01/06/16 0451  WBC 10.8* 7.8  NEUTROABS 7.6  --   HGB 16.5 13.7  HCT 47.7 40.6  MCV 97.5 97.1  PLT 264 241   Basic Metabolic Panel:  Recent Labs Lab 01/05/16 0533 01/06/16 0451  NA 139 138  K 3.1* 3.2*  CL 105 102  CO2 24 24  GLUCOSE 278* 283*  BUN 16 26*  CREATININE 1.00 1.05  CALCIUM 7.9* 8.7*   GFR: Estimated Creatinine Clearance: 68.8 mL/min (by C-G formula based on SCr of 1.05 mg/dL). Liver Function Tests:  Recent Labs Lab 01/05/16 0533  AST 74*  ALT 59  ALKPHOS 83  BILITOT 0.4  PROT 7.3  ALBUMIN 4.0   Cardiac Enzymes:  Recent Labs Lab 01/05/16 0533 01/06/16 1634 01/06/16 2233 01/07/16 0348  TROPONINI 0.03* 0.03* 0.05* 0.07*   CBG:  Recent Labs Lab 01/06/16 0718 01/06/16 1118 01/06/16 1551 01/06/16 2117 01/07/16 0730  GLUCAP 340* 309* 230* 369* 268*   Lipid Profile: No results for input(s): CHOL, HDL, LDLCALC, TRIG, CHOLHDL, LDLDIRECT in the last 72 hours. Thyroid Function Tests:  Recent Labs  01/05/16 0533  TSH 0.885   Anemia Panel: No results for input(s): VITAMINB12, FOLATE, FERRITIN, TIBC, IRON, RETICCTPCT in the last 72 hours. Sepsis Labs:  Recent Labs Lab 01/05/16 0709 01/05/16 1911  LATICACIDVEN 1.71 7.39*    Recent Results (from the past 240 hour(s))  Blood culture (routine x 2)     Status: None (Preliminary result)   Collection Time: 01/05/16  6:52 AM  Result Value Ref Range Status   Specimen Description RIGHT ANTECUBITAL  Final   Special Requests BOTTLES DRAWN AEROBIC AND ANAEROBIC 6CC EACH  Final   Culture NO GROWTH 1 DAY  Final   Report Status PENDING  Incomplete  Blood culture (routine x 2)     Status: None (Preliminary result)    Collection Time: 01/05/16  7:02 AM  Result Value Ref Range Status   Specimen Description BLOOD RIGHT HAND  Final   Special Requests BOTTLES DRAWN AEROBIC ONLY 6CC  Final   Culture NO GROWTH 1 DAY  Final   Report Status PENDING  Incomplete  MRSA PCR Screening     Status: None   Collection Time: 01/05/16  9:28 AM  Result Value Ref Range Status   MRSA by PCR NEGATIVE  NEGATIVE Final    Comment:        The GeneXpert MRSA Assay (FDA approved for NASAL specimens only), is one component of a comprehensive MRSA colonization surveillance program. It is not intended to diagnose MRSA infection nor to guide or monitor treatment for MRSA infections.      Radiology Studies: No results found.  Scheduled Meds: . ALPRAZolam  1 mg Oral TID  . aspirin EC  81 mg Oral q morning - 10a  . benazepril  40 mg Oral q morning - 10a  . enoxaparin (LOVENOX) injection  40 mg Subcutaneous Q24H  . glipiZIDE  10 mg Oral QPM  . hydrochlorothiazide  25 mg Oral Daily  . insulin aspart  0-20 Units Subcutaneous TID WC  . insulin aspart  0-5 Units Subcutaneous QHS  . levofloxacin  750 mg Oral Daily  . metFORMIN  1,000 mg Oral BID WC  . metoprolol tartrate  50 mg Oral BID  . nicotine  21 mg Transdermal Daily  . pantoprazole  40 mg Oral Daily  . pioglitazone  15 mg Oral Daily  . potassium chloride  40 mEq Oral Daily  . predniSONE  40 mg Oral QAC breakfast  . rosuvastatin  5 mg Oral q morning - 10a  . venlafaxine XR  150 mg Oral Q breakfast   Continuous Infusions:   LOS: 2 days   Nevaeha Finerty, MD FACP Hospitalist.   If 7PM-7AM, please contact night-coverage [email or web address] Password [facility] 01/07/2016, 8:49 AM

## 2016-01-07 NOTE — Progress Notes (Signed)
Inpatient Diabetes Program Recommendations  AACE/ADA: New Consensus Statement on Inpatient Glycemic Control (2015)  Target Ranges:  Prepandial:   less than 140 mg/dL      Peak postprandial:   less than 180 mg/dL (1-2 hours)      Critically ill patients:  140 - 180 mg/dL  Results for [name], Cody [name] (MRN [id number]) as of 01/07/2016 08:47  Ref. Range 01/06/2016 07:18 01/06/2016 11:18 01/06/2016 15:51 01/06/2016 21:17 01/07/2016 07:30  Glucose-Capillary Latest Ref Range: 65 - 99 mg/dL 102 (H) 585 (H) 277 (H) 369 (H) 268 (H)    Review of Glycemic Control  Diabetes history: DM2 Outpatient Diabetes medications: Glipizide XL 10 mg QPM, Metformin XR 1000 mg BID, Actos 15 mg daily Current orders for Inpatient glycemic control: Glipizide XL 10 mg QPM, Metformin XR 1000 mg BID, Actos 15 mg daily, Novolog 0-20 units TID with meals, Novolog 0-5 units QHS  Inpatient Diabetes Program Recommendations: Insulin - Basal: While inpatient and ordered steroids, please consider ordering Lantus 12 units daily starting now (based on 80 kg x 0.15 units). Insulin - Meal Coverage: While inpatient and ordered steroids, please consider ordering Novolog 5 units TID with meals for meal coverage.  Thanks, Orlando Penner, RN, MSN, CDE Diabetes Coordinator Inpatient Diabetes Program [phone] (Team Pager from 8am to 5pm)

## 2016-01-07 NOTE — Progress Notes (Signed)
Critical troponin called by lab of 0.07. MD notified via text [place]

## 2016-01-08 LAB — BLOOD CULTURE ID PANEL (REFLEXED)

## 2016-01-08 LAB — GLUCOSE, CAPILLARY: GLUCOSE-CAPILLARY: 118 mg/dL — AB (ref 65–99)

## 2016-01-08 MED ORDER — METOPROLOL TARTRATE 50 MG PO TABS: 50.0000 mg | ORAL_TABLET | Freq: Two times a day (BID) | ORAL | 1 refills | Status: DC

## 2016-01-08 MED ORDER — HYDROCHLOROTHIAZIDE 25 MG PO TABS: 25.0000 mg | ORAL_TABLET | Freq: Every day | ORAL | 1 refills | Status: DC

## 2016-01-08 MED ORDER — ROSUVASTATIN CALCIUM 5 MG PO TABS
5.0000 mg | ORAL_TABLET | Freq: Every morning | ORAL | 1 refills | Status: DC
Start: 2016-01-08 — End: 2016-01-22

## 2016-01-08 MED ORDER — NICOTINE 21 MG/24HR TD PT24: 21.0000 mg | MEDICATED_PATCH | Freq: Every day | TRANSDERMAL | 0 refills | Status: DC

## 2016-01-08 MED ORDER — LEVOFLOXACIN 750 MG PO TABS: 750.0000 mg | ORAL_TABLET | Freq: Every day | ORAL | 0 refills | Status: DC

## 2016-01-08 NOTE — Discharge Summary (Signed)
Physician Discharge Summary  Cody [name] JXB:[id number] DOB: May 25, 1950 DOA: 01/05/2016  PCP: Dwana Melena, MD  Admit date: 01/05/2016 Discharge date: 01/08/2016  Admitted From: Home. Disposition:  Home.   Recommendations for Outpatient Follow-up:  1. Follow up with PCP in 1-2 weeks 2. Follow up with cardiology as suggested.   Home Health: None.  Equipment/Devices: None.  Discharge Condition: Much improved.  Did not require oxygen.  CODE STATUS: FULL CODE.  Diet recommendation: Cardiac diet.   Brief/Interim Summary:  Patient was admitted by me on Jan 05, 2016 for SOB.  As per my prior H and P:  " Cody [name] is a 65 y.o. male with medical history significant for asthma, COPD not on home oxygen, tobacco abuse of 1.5 ppd, DM type 2, and HTN, presents via EMS with complaints of shortness of breath that onset while waking up this morning. He states he took one albuterol with mild improvement. He has an associated cough and wheezing. He denies any chest pain, fever, or chills. In the ER, he was found to be quite hypertensive, in respiratory distress, and was placed on Bipap, given oxygen, IV steroids, nebs, and IV Lasix.  He diuresed and felt better.  Work up included CXR with pulmonary edema,  K of 3.1, WBC normal, and EKG showed ST with no acute ST T changes.   Hospitalist was asked to admit the patient for further evaluation of respiratory failure with hypoxia.   ED Course: Potassium 3.1, glucose 278, BNP 138, WBC 10.8   Review of Systems: As per HPI otherwise 10 point review of systems negative.   HOSPITAL COURSE:   65 yo with tobacco abuse, COPD, HTN, DM, admitted for acute on chronic respiratory failure (COPD exacerbation), CHF, was admitted into the ICU, and subsequently his Bipap was quickly converted into [place] and he was given IV Lasix, steroids, nebs, and antibiotics. He felt much better right away.  On the following day, he was noted to be tachycardic.  He doesn't drink alcohol, but has  been on chronic benzo at home.  Therefore, his benzo was continued, His neb was changed to Xopenex, and he was started on BB, with improvement of his symptoms. Betablocker was subsequently increase as his BP was found to be elevated.  It was felt that his BP was likely out of control previously, and he admitted to have not been compliant with his medications, and that he hadn't been taking good care of himself.  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 His Troponins were only slightly elevated.  He was continued on his medication, and subsequently his BP improved, and HR improved, and he was completely weaned off oxygen.  He felt better and is anxious to go home.  He will be discharged home today on short course of prednisone, and finishing his oral antbiotics.  I think he should see a cardiologist outpatient, and I made that recommendation to him.  He must be compliant with his meds, so his BP can be more tightly controlled.  He must give up cigarettes as well.  He will see his PCP in follow up. Thank you for allowing me to participate in his care.  Good Day.   Discharge Diagnoses:  Principal Problem:   Respiratory failure with hypoxia (HCC) Active Problems:   Pulmonary edema   COPD with acute exacerbation (HCC)   HTN (hypertension)   DM (diabetes mellitus) (HCC)   Respiratory failure (HCC)  Discharge Instructions  Discharge Instructions    Diet - low sodium heart healthy    Complete by:  As directed    Discharge instructions    Complete by:  As directed    Please take your medications as prescribed.  Don't smoke.  Please follow up with your doctor regularly.   Increase activity slowly    Complete by:  As directed        Medication List    STOP taking these medications   MUCINEX DM MAXIMUM STRENGTH 60-1200 MG Tb12     TAKE these medications   ALPRAZolam 1 MG tablet Commonly known as:  XANAX Take 1 mg by mouth 3  (three) times daily.   aspirin EC 81 MG tablet Take 81 mg by mouth every morning.   benazepril 40 MG tablet Commonly known as:  LOTENSIN Take 40 mg by mouth every morning.   glipiZIDE 10 MG 24 hr tablet Commonly known as:  GLUCOTROL XL Take 10 mg by mouth every evening.   hydrochlorothiazide 25 MG tablet Commonly known as:  HYDRODIURIL Take 1 tablet (25 mg total) by mouth daily.   levofloxacin 750 MG tablet Commonly known as:  LEVAQUIN Take 1 tablet (750 mg total) by mouth daily.   metFORMIN 500 MG 24 hr tablet Commonly known as:  GLUCOPHAGE-XR Take 1,000 mg by mouth 2 (two) times daily.   metoprolol 50 MG tablet Commonly known as:  LOPRESSOR Take 1 tablet (50 mg total) by mouth 2 (two) times daily.   nicotine 21 mg/24hr patch Commonly known as:  NICODERM CQ - dosed in mg/24 hours Place 1 patch (21 mg total) onto the skin daily.   omeprazole 20 MG capsule Commonly known as:  PRILOSEC Take 20 mg by mouth daily.   oxyCODONE-acetaminophen 7.5-325 MG tablet Commonly known as:  PERCOCET Take 1 tablet by mouth 3 (three) times daily.   pioglitazone 15 MG tablet Commonly known as:  ACTOS Take 15 mg by mouth daily.   predniSONE 20 MG tablet Commonly known as:  DELTASONE Take 2 tablets (40 mg total) by mouth daily with breakfast. For the next four days   PROAIR HFA 108 (90 Base) MCG/ACT inhaler Generic drug:  albuterol Inhale 1-2 puffs into the lungs every 6 (six) hours as needed for wheezing or shortness of breath.   rosuvastatin 5 MG tablet Commonly known as:  CRESTOR Take 1 tablet (5 mg total) by mouth every morning.   sodium chloride 0.65 % Soln nasal spray Commonly known as:  OCEAN Place 2 sprays into both nostrils as needed for congestion.   venlafaxine XR 150 MG 24 hr capsule Commonly known as:  EFFEXOR-XR Take 150 mg by mouth daily with breakfast.       Allergies  Allergen Reactions  . Penicillins     Has patient had a PCN reaction causing  immediate rash, facial/tongue/throat swelling, SOB or lightheadedness with hypotension:unknown Has patient had a PCN reaction causing severe rash involving mucus membranes or skin necrosis: unknown Has patient had a PCN reaction that required hospitalization; unknown Has patient had a PCN reaction occurring within the last 10 years; unknown If all of the above answers are "NO", then may proceed with Cephalosporin use.     Consultations:  None.    Procedures/Studies: Dg Chest Portable 1 View  Result Date: 01/05/2016 CLINICAL DATA:  Shortness of breath. EXAM: PORTABLE CHEST 1 VIEW COMPARISON:  11/11/2015 FINDINGS: Mild cardiac enlargement and pulmonary vascular congestion. Interstitial changes in the lungs suggesting  edema. Superimposed focal consolidation in the right lung base may represent superimposed pneumonia or asymmetrical edema. No blunting of costophrenic angles. No pneumothorax. IMPRESSION: Cardiac enlargement with mild vascular congestion and diffuse interstitial edema. Asymmetrical edema versus superimposed consolidation in the right lung base. Electronically Signed   By: Burman Nieves M.D.   On: 01/05/2016 06:09       Subjective:  I feel much better.    Discharge Exam: Vitals:   01/08/16 0700 01/08/16 0829  BP: (!) 153/99   Pulse: 83   Resp: 16   Temp:  98.1 F (36.7 C)   Vitals:   01/08/16 0500 01/08/16 0600 01/08/16 0700 01/08/16 0829  BP: 139/88 131/78 (!) 153/99   Pulse: 64 75 83   Resp: 16 16 16    Temp:    98.1 F (36.7 C)  TempSrc:    Oral  SpO2: 97% 99% 99%   Weight: 80.6 kg (177 lb 11.1 oz)     Height:        General: Pt is alert, awake, not in acute distress Cardiovascular: RRR, S1/S2 +, no rubs, no gallops Respiratory: CTA bilaterally, no wheezing, no rhonchi Abdominal: Soft, NT, ND, bowel sounds + Extremities: no edema, no cyanosis    The results of significant diagnostics from this hospitalization (including imaging, microbiology,  ancillary and laboratory) are listed below for reference.     Microbiology: Recent Results (from the past 240 hour(s))  Blood culture (routine x 2)     Status: None (Preliminary result)   Collection Time: 01/05/16  6:52 AM  Result Value Ref Range Status   Specimen Description RIGHT ANTECUBITAL  Final   Special Requests BOTTLES DRAWN AEROBIC AND ANAEROBIC 6CC EACH  Final   Culture NO GROWTH 2 DAYS  Final   Report Status PENDING  Incomplete  Blood culture (routine x 2)     Status: None (Preliminary result)   Collection Time: 01/05/16  7:02 AM  Result Value Ref Range Status   Specimen Description BLOOD RIGHT HAND  Final   Special Requests BOTTLES DRAWN AEROBIC ONLY 6CC  Final   Culture NO GROWTH 2 DAYS  Final   Report Status PENDING  Incomplete  MRSA PCR Screening     Status: None   Collection Time: 01/05/16  9:28 AM  Result Value Ref Range Status   MRSA by PCR NEGATIVE NEGATIVE Final    Comment:        The GeneXpert MRSA Assay (FDA approved for NASAL specimens only), is one component of a comprehensive MRSA colonization surveillance program. It is not intended to diagnose MRSA infection nor to guide or monitor treatment for MRSA infections.      Labs: BNP (last 3 results)  Recent Labs  11/11/15 1955 01/05/16 0533  BNP 122.0* 138.0*   Basic Metabolic Panel:  Recent Labs Lab 01/05/16 0533 01/06/16 0451  NA 139 138  K 3.1* 3.2*  CL 105 102  CO2 24 24  GLUCOSE 278* 283*  BUN 16 26*  CREATININE 1.00 1.05  CALCIUM 7.9* 8.7*   Liver Function Tests:  Recent Labs Lab 01/05/16 0533  AST 74*  ALT 59  ALKPHOS 83  BILITOT 0.4  PROT 7.3  ALBUMIN 4.0   CBC:  Recent Labs Lab 01/05/16 0533 01/06/16 0451  WBC 10.8* 7.8  NEUTROABS 7.6  --   HGB 16.5 13.7  HCT 47.7 40.6  MCV 97.5 97.1  PLT 264 241   Cardiac Enzymes:  Recent Labs Lab 01/05/16 0533 01/06/16 1634  01/06/16 2233 01/07/16 0348  TROPONINI 0.03* 0.03* 0.05* 0.07*   BNP: Invalid  input(s): POCBNP CBG:  Recent Labs Lab 01/07/16 0730 01/07/16 1134 01/07/16 1645 01/07/16 2122 01/08/16 0731  GLUCAP 268* 240* 151* 228* 118*   Urinalysis    Component Value Date/Time   COLORURINE YELLOW 08/14/2006 1103   APPEARANCEUR CLEAR 08/14/2006 1103   LABSPEC 1.020 08/14/2006 1103   PHURINE 5.5 08/14/2006 1103   GLUCOSEU 250 (A) 08/14/2006 1103   HGBUR LARGE (A) 08/14/2006 1103   BILIRUBINUR NEGATIVE 08/14/2006 1103   KETONESUR NEGATIVE 08/14/2006 1103   PROTEINUR NEGATIVE 08/14/2006 1103   UROBILINOGEN 0.2 08/14/2006 1103   NITRITE NEGATIVE 08/14/2006 1103   LEUKOCYTESUR NEGATIVE 08/14/2006 1103   Microbiology Recent Results (from the past 240 hour(s))  Blood culture (routine x 2)     Status: None (Preliminary result)   Collection Time: 01/05/16  6:52 AM  Result Value Ref Range Status   Specimen Description RIGHT ANTECUBITAL  Final   Special Requests BOTTLES DRAWN AEROBIC AND ANAEROBIC 6CC EACH  Final   Culture NO GROWTH 2 DAYS  Final   Report Status PENDING  Incomplete  Blood culture (routine x 2)     Status: None (Preliminary result)   Collection Time: 01/05/16  7:02 AM  Result Value Ref Range Status   Specimen Description BLOOD RIGHT HAND  Final   Special Requests BOTTLES DRAWN AEROBIC ONLY 6CC  Final   Culture NO GROWTH 2 DAYS  Final   Report Status PENDING  Incomplete  MRSA PCR Screening     Status: None   Collection Time: 01/05/16  9:28 AM  Result Value Ref Range Status   MRSA by PCR NEGATIVE NEGATIVE Final    Comment:        The GeneXpert MRSA Assay (FDA approved for NASAL specimens only), is one component of a comprehensive MRSA colonization surveillance program. It is not intended to diagnose MRSA infection nor to guide or monitor treatment for MRSA infections.      Time coordinating discharge: Over 30 minutes  SIGNED:   Houston Siren, MD FACP [facility] 01/08/2016, 8:52 AM   If 7PM-7AM, please contact  night-coverage [email or web address] Password [email or web address]

## 2016-01-08 NOTE — Progress Notes (Signed)
Pt being discharged home. Instructions, including medications and F/U appointments, discussed with pt; teach back successful. PIV removed with no complications. Pt currently waiting on daughter to pick him up.

## 2016-01-10 LAB — CULTURE, BLOOD (ROUTINE X 2): CULTURE: NO GROWTH

## 2016-01-12 LAB — CULTURE, BLOOD (ROUTINE X 2)

## 2016-01-21 ENCOUNTER — Encounter: Payer: Self-pay | Admitting: Cardiology

## 2016-01-21 NOTE — Progress Notes (Signed)
Cardiology Office Note  Date: 01/22/2016   ID: Virgie Damer Dovel, DOB 01-Mar-1950, MRN [id number]  PCP: Dwana Melena, MD  Referring provider: Houston Siren, MD Consulting Cardiologist: Nona Dell, MD   Chief Complaint  Patient presents with  . Cardiomyopathy    History of Present Illness: Cody [name] is a 65 y.o. male referred for cardiology consultation by Dr. Conley Rolls. He was recently hospitalized with hypoxic respiratory failure associated with COPD exacerbation, managed with BiPAP and supportive medical therapy including antibiotics, nebulizer treatments, and steroids. Workup included troponin I levels which were minimally elevated up to 0.07. Echocardiogram revealed LV global hypokinesis with LVEF 45%. Of note, he was significantly hypertensive at presentation.  He is here today with his daughter. States he feels much better. He is actively trying to quit smoking, using nicotine patches. He does not report any chest pain. He still having some cough and mild chest congestion but no wheezing.  We discussed the results of his echocardiogram. Blood pressure is noted to be low today, he has felt weak. We discussed medication adjustments. Lopressor was already cut back to 25 mg twice daily by Dr. Margo Aye at recent visit.  Past Medical History:  Diagnosis Date  . Asthma   . COPD (chronic obstructive pulmonary disease) (HCC)   . Essential hypertension   . Type 2 diabetes mellitus (HCC)     Past Surgical History:  Procedure Laterality Date  . AMPUTATION  01/01/2012   Procedure: AMPUTATION DIGIT;  Surgeon: Tami Ribas, MD;  Location: [facility] OR;  Service: Orthopedics;  Laterality: Right;  Right Ring and small finger revision of amputation, I&D and pinning  . APPENDECTOMY    . BACK SURGERY    . CLOSED REDUCTION FINGER WITH PERCUTANEOUS PINNING  01/01/2012   Procedure: CLOSED REDUCTION FINGER WITH PERCUTANEOUS PINNING;  Surgeon: Tami Ribas, MD;  Location: [facility] OR;  Service: Orthopedics;  Laterality:  Right;    Current Outpatient Prescriptions  Medication Sig Dispense Refill  . albuterol (PROAIR HFA) 108 (90 Base) MCG/ACT inhaler Inhale 1-2 puffs into the lungs every 6 (six) hours as needed for wheezing or shortness of breath.    . ALPRAZolam (XANAX) 1 MG tablet Take 1 mg by mouth 3 (three) times daily.    Marland Kitchen aspirin EC 81 MG tablet Take 81 mg by mouth every morning.    . benazepril (LOTENSIN) 20 MG tablet Take 1 tablet (20 mg total) by mouth daily. 30 tablet 6  . glipiZIDE (GLUCOTROL XL) 10 MG 24 hr tablet Take 10 mg by mouth every evening.     . hydrochlorothiazide (HYDRODIURIL) 25 MG tablet Take 1 tablet (25 mg total) by mouth daily. 30 tablet 1  . metFORMIN (GLUCOPHAGE-XR) 500 MG 24 hr tablet Take 1,000 mg by mouth 2 (two) times daily.    Marland Kitchen omeprazole (PRILOSEC) 20 MG capsule Take 20 mg by mouth daily.    Marland Kitchen oxyCODONE-acetaminophen (PERCOCET) 7.5-325 MG tablet Take 1 tablet by mouth 3 (three) times daily.    . pioglitazone (ACTOS) 15 MG tablet Take 15 mg by mouth daily.    . sodium chloride (OCEAN) 0.65 % SOLN nasal spray Place 2 sprays into both nostrils as needed for congestion.    Marland Kitchen venlafaxine XR (EFFEXOR-XR) 150 MG 24 hr capsule Take 150 mg by mouth daily with breakfast.    . carvedilol (COREG) 6.25 MG tablet Take 1 tablet (6.25 mg total) by mouth 2 (two) times daily. 60 tablet 6   No current facility-administered  medications for this visit.    Allergies:  Penicillins   Social History: The patient  reports that he quit smoking about 2 weeks ago. His smoking use included Cigarettes. He smoked 1.00 pack per day. He has never used smokeless tobacco. He reports that he drinks about 6.0 oz of alcohol per week . He reports that he does not use drugs.   Family History: The patient's family history is not on file.   ROS:  Please see the history of present illness. Otherwise, complete review of systems is positive for none.  All other systems are reviewed and negative.   Physical  Exam: VS:  BP (!) 88/50   Pulse 72   Ht 5\' 8"  (1.727 m)   Wt 193 lb (87.5 kg)   SpO2 95%   BMI 29.35 kg/m , BMI Body mass index is 29.35 kg/m.  Wt Readings from Last 3 Encounters:  01/22/16 193 lb (87.5 kg)  01/08/16 177 lb 11.1 oz (80.6 kg)  11/11/15 185 lb (83.9 kg)    General: Patient appears comfortable at rest. HEENT: Conjunctiva and lids normal, oropharynx clear. Neck: Supple, no elevated JVP or carotid bruits, no thyromegaly. Lungs: Diminished breath sounds but no wheezing, nonlabored breathing at rest. Cardiac: Regular rate and rhythm, no S3 or significant systolic murmur, no pericardial rub. Abdomen: Soft, nontender, bowel sounds present, no guarding or rebound. Extremities: No pitting edema, distal pulses 2+. Skin: Warm and dry. Musculoskeletal: No kyphosis. Neuropsychiatric: Alert and oriented x3, affect grossly appropriate.  ECG: I personally reviewed the tracing from 01/05/2016 which showed sinus tachycardia with PVC, poor R wave progression rule out old anterior infarct pattern, left anterior fascicular block, and diffuse nonspecific ST-T changes.  Recent Labwork: 01/05/2016: ALT 59; AST 74; B Natriuretic Peptide 138.0; TSH 0.885 01/06/2016: BUN 26; Creatinine, Ser 1.05; Hemoglobin 13.7; Platelets 241; Potassium 3.2; Sodium 138   Other Studies Reviewed Today:  Echocardiogram 01/06/2016: Study Conclusions  - Left ventricle: The cavity size was normal. Wall thickness was   increased in a pattern of mild LVH. The estimated ejection   fraction was 45%. Diffuse hypokinesis. The study is not   technically sufficient to allow evaluation of LV diastolic   function. - Aortic valve: Mildly calcified annulus. Trileaflet. - Mitral valve: Calcified annulus. There was trivial regurgitation. - Left atrium: The atrium was at the upper limits of normal in   size. - Right atrium: Central venous pressure (est): 8 mm Hg. - Atrial septum: No defect or patent foramen ovale was  identified. - Tricuspid valve: There was trivial regurgitation. - Pulmonary arteries: Systolic pressure could not be accurately   estimated. - Pericardium, extracardiac: There was no pericardial effusion.  Impressions:  - Mild LVH with LVEF approximately 45%, diffuse hypokinesis.   Indeterminate diastolic function. Upper normal left atrial size.   Mild mitral annular calcification with trivial mitral   regurgitation. Trivial tricuspid regurgitation.  Chest x-ray 01/05/2016: FINDINGS: Mild cardiac enlargement and pulmonary vascular congestion. Interstitial changes in the lungs suggesting edema. Superimposed focal consolidation in the right lung base may represent superimposed pneumonia or asymmetrical edema. No blunting of costophrenic angles. No pneumothorax.  IMPRESSION: Cardiac enlargement with mild vascular congestion and diffuse interstitial edema. Asymmetrical edema versus superimposed consolidation in the right lung base.  Assessment and Plan:  1. Cardiomyopathy, LVEF approximately 45% with diffuse hypokinesis and indeterminate diastolic function. This was discovered in the setting of hypertensive urgency and COPD exacerbation with hypoxic respiratory failure. Minor troponin I elevation more than  likely related to myocardial strain than ACS at that time. He does not report any chest pain and feels better. Plan is to reduce Lotensin to 20 mg daily, continue HCTZ, change from Lopressor to Coreg 6.25 mg twice daily. Will bring him back to the office to reassess  2. COPD with tobacco abuse. He is actively trying to quit smoking. Currently on inhalers and has follow-up with Dr. Margo AyeHall.  3. Essential hypertension, hypertensive urgency at presentation with hospital stay. Blood pressure is low now and medication adjustments are being made.  4. Type 2 diabetes mellitus, followed by Dr. Margo AyeHall.  Current medicines were reviewed with the patient today.  Disposition: Follow-up in one  month.  Signed, Jonelle SidleSamuel G. McDowell, MD, [facility] 01/22/2016 10:52 AM    [facility] [address], [place], [place] [address] Phone: [phone]; Fax: [phone]

## 2016-01-22 ENCOUNTER — Ambulatory Visit (INDEPENDENT_AMBULATORY_CARE_PROVIDER_SITE_OTHER): Payer: Medicare HMO | Admitting: Cardiology

## 2016-01-22 ENCOUNTER — Encounter: Payer: Self-pay | Admitting: Cardiology

## 2016-01-22 VITALS — BP 88/50 | HR 72 | Ht 68.0 in | Wt 193.0 lb

## 2016-01-22 DIAGNOSIS — I10 Essential (primary) hypertension: Secondary | ICD-10-CM | POA: Diagnosis not present

## 2016-01-22 DIAGNOSIS — J449 Chronic obstructive pulmonary disease, unspecified: Secondary | ICD-10-CM

## 2016-01-22 DIAGNOSIS — I429 Cardiomyopathy, unspecified: Secondary | ICD-10-CM | POA: Diagnosis not present

## 2016-01-22 DIAGNOSIS — Z720 Tobacco use: Secondary | ICD-10-CM

## 2016-01-22 MED ORDER — CARVEDILOL 6.25 MG PO TABS: 6.2500 mg | ORAL_TABLET | Freq: Two times a day (BID) | ORAL | 6 refills | Status: DC

## 2016-01-22 MED ORDER — BENAZEPRIL HCL 20 MG PO TABS: 20.0000 mg | ORAL_TABLET | Freq: Every day | ORAL | 6 refills | Status: DC

## 2016-01-22 NOTE — Patient Instructions (Addendum)
Medication Instructions:   Stop Lopressor (Metoprolol tart).  Begin Coreg 6.25mg  twice a day.  Remain off of the Crestor.  Decrease Lotensin to 20mg  daily.    Continue all other medications.    Labwork: none  Testing/Procedures: none  Follow-Up: 1 month  Any Other Special Instructions Will Be Listed Below (If Applicable).  If you need a refill on your cardiac medications before your next appointment, please call your pharmacy.

## 2016-02-12 DIAGNOSIS — G894 Chronic pain syndrome: Secondary | ICD-10-CM | POA: Diagnosis not present

## 2016-02-12 DIAGNOSIS — E785 Hyperlipidemia, unspecified: Secondary | ICD-10-CM | POA: Diagnosis not present

## 2016-02-12 DIAGNOSIS — E1165 Type 2 diabetes mellitus with hyperglycemia: Secondary | ICD-10-CM | POA: Diagnosis not present

## 2016-02-12 DIAGNOSIS — I10 Essential (primary) hypertension: Secondary | ICD-10-CM | POA: Diagnosis not present

## 2016-02-12 DIAGNOSIS — J449 Chronic obstructive pulmonary disease, unspecified: Secondary | ICD-10-CM | POA: Diagnosis not present

## 2016-02-20 DIAGNOSIS — E1165 Type 2 diabetes mellitus with hyperglycemia: Secondary | ICD-10-CM | POA: Diagnosis not present

## 2016-02-26 ENCOUNTER — Encounter: Payer: Self-pay | Admitting: Cardiology

## 2016-02-26 ENCOUNTER — Ambulatory Visit (INDEPENDENT_AMBULATORY_CARE_PROVIDER_SITE_OTHER): Payer: Medicare Other | Admitting: Cardiology

## 2016-02-26 VITALS — BP 119/78 | HR 93 | Ht 68.0 in | Wt 176.0 lb

## 2016-02-26 DIAGNOSIS — Z720 Tobacco use: Secondary | ICD-10-CM

## 2016-02-26 DIAGNOSIS — I10 Essential (primary) hypertension: Secondary | ICD-10-CM | POA: Diagnosis not present

## 2016-02-26 DIAGNOSIS — E1165 Type 2 diabetes mellitus with hyperglycemia: Secondary | ICD-10-CM | POA: Diagnosis not present

## 2016-02-26 DIAGNOSIS — I429 Cardiomyopathy, unspecified: Secondary | ICD-10-CM | POA: Diagnosis not present

## 2016-02-26 NOTE — Patient Instructions (Signed)
Your physician recommends that you schedule a follow-up appointment in: 2 MONTHS WITH DR MCDOWELL  Your physician recommends that you continue on your current medications as directed. Please refer to the Current Medication list given to you today.  Your physician has requested that you have an echocardiogram. Echocardiography is a painless test that uses sound waves to create images of your heart. It provides your doctor with information about the size and shape of your heart and how well your heart's chambers and valves are working. This procedure takes approximately one hour. There are no restrictions for this procedure.  Thank you for choosing [facility]!!    

## 2016-02-26 NOTE — Progress Notes (Signed)
Cardiology Office Note  Date: 02/26/2016   ID: Cody [name], DOB May 13, 1950, MRN [id number]  PCP: Wende Neighbors, MD  Primary Cardiologist: Rozann Lesches, MD   Chief Complaint  Patient presents with  . Cardiomyopathy    History of Present Illness: Cody [name] is a 66 y.o. male that I met recently in December 2017. He presents for a follow-up visit. States that he has had no significant shortness of breath with activity, no chest pain. Most recently he has undergone further medication adjustments due to poorly controlled diabetes. He is working with Dr. Nevada Crane.  Medications were adjusted at the last visit, we switched his beta blocker to Coreg. Present cardiac regimen includes Coreg, Lotensin, Crestor, HCTZ, and aspirin.  I reviewed his recent blood work as outlined below.  Past Medical History:  Diagnosis Date  . Asthma   . COPD (chronic obstructive pulmonary disease) ([place])   . Essential hypertension   . Type 2 diabetes mellitus ([place])     Past Surgical History:  Procedure Laterality Date  . AMPUTATION  01/01/2012   Procedure: AMPUTATION DIGIT;  Surgeon: Tennis Must, MD;  Location: [place];  Service: Orthopedics;  Laterality: Right;  Right Ring and small finger revision of amputation, I&D and pinning  . APPENDECTOMY    . BACK SURGERY    . CLOSED REDUCTION FINGER WITH PERCUTANEOUS PINNING  01/01/2012   Procedure: CLOSED REDUCTION FINGER WITH PERCUTANEOUS PINNING;  Surgeon: Tennis Must, MD;  Location: [place];  Service: Orthopedics;  Laterality: Right;    Current Outpatient Prescriptions  Medication Sig Dispense Refill  . albuterol (PROAIR HFA) 108 (90 Base) MCG/ACT inhaler Inhale 1-2 puffs into the lungs every 6 (six) hours as needed for wheezing or shortness of breath.    . ALPRAZolam (XANAX) 1 MG tablet Take 1 mg by mouth 3 (three) times daily.    Marland Kitchen aspirin EC 81 MG tablet Take 81 mg by mouth every morning.    . benazepril (LOTENSIN) 40 MG tablet Take 40 mg by mouth daily.     . carvedilol (COREG) 6.25 MG tablet Take 1 tablet (6.25 mg total) by mouth 2 (two) times daily. 60 tablet 6  . hydrochlorothiazide (HYDRODIURIL) 25 MG tablet Take 1 tablet (25 mg total) by mouth daily. 30 tablet 1  . Insulin Glargine (TOUJEO SOLOSTAR) 300 UNIT/ML SOPN Inject 32 Units into the skin daily.    . metFORMIN (GLUCOPHAGE-XR) 500 MG 24 hr tablet Take 1,000 mg by mouth 2 (two) times daily.    . NUCYNTA 50 MG tablet Take 50 mg by mouth 2 (two) times daily.     Marland Kitchen omeprazole (PRILOSEC) 20 MG capsule Take 20 mg by mouth daily.    Glory Rosebush DELICA LANCETS FINE MISC     . ONETOUCH VERIO test strip     . oxyCODONE-acetaminophen (PERCOCET) 7.5-325 MG tablet Take 1 tablet by mouth 3 (three) times daily.    . rosuvastatin (CRESTOR) 5 MG tablet Take 2.5 mg by mouth at bedtime.     . sodium chloride (OCEAN) 0.65 % SOLN nasal spray Place 2 sprays into both nostrils as needed for congestion.    Marland Kitchen venlafaxine XR (EFFEXOR-XR) 150 MG 24 hr capsule Take 150 mg by mouth daily with breakfast.     No current facility-administered medications for this visit.    Allergies:  Penicillins   Social History: The patient  reports that he quit smoking about 7 weeks ago. His smoking use included Cigarettes. He  smoked 1.00 pack per day. He has never used smokeless tobacco. He reports that he drinks about 6.0 oz of alcohol per week . He reports that he does not use drugs.   ROS:  Please see the history of present illness. Otherwise, complete review of systems is positive for none.  All other systems are reviewed and negative.   Physical Exam: VS:  BP 119/78   Pulse 93   Ht '5\' 8"'$  (1.727 m)   Wt 176 lb (79.8 kg)   SpO2 96%   BMI 26.76 kg/m , BMI Body mass index is 26.76 kg/m.  Wt Readings from Last 3 Encounters:  02/26/16 176 lb (79.8 kg)  01/22/16 193 lb (87.5 kg)  01/08/16 177 lb 11.1 oz (80.6 kg)    General: Patient appears comfortable at rest. HEENT: Conjunctiva and lids normal, oropharynx  clear. Neck: Supple, no elevated JVP or carotid bruits, no thyromegaly. Lungs: Diminished breath sounds but no wheezing, nonlabored breathing at rest. Cardiac: Regular rate and rhythm, no S3 or significant systolic murmur, no pericardial rub. Abdomen: Soft, nontender, bowel sounds present, no guarding or rebound. Extremities: No pitting edema, distal pulses 2+. Skin: Warm and dry. Musculoskeletal: No kyphosis. Neuropsychiatric: Alert and oriented x3, affect grossly appropriate.  ECG: I personally reviewed the tracing from 01/05/2016 which showed sinus tachycardia with PVC, poor R wave progression rule out old anterior infarct pattern, left anterior fascicular block, and diffuse nonspecific ST-T changes.  Recent Labwork: 01/05/2016: ALT 59; AST 74; B Natriuretic Peptide 138.0; TSH 0.885 01/06/2016: BUN 26; Creatinine, Ser 1.05; Hemoglobin 13.7; Platelets 241; Potassium 3.2; Sodium 138  December 2017: Cholesterol 271, HDL 39, triglycerides 639, glucose 291, potassium 4.4, BUN 17, creatinine 1.1, AST 16, ALT 20, hemoglobin A1c 11.2, hemoglobin 15.4, platelets 198  Other Studies Reviewed Today:  Echocardiogram 01/06/2016: Study Conclusions  - Left ventricle: The cavity size was normal. Wall thickness was increased in a pattern of mild LVH. The estimated ejection fraction was 45%. Diffuse hypokinesis. The study is not technically sufficient to allow evaluation of LV diastolic function. - Aortic valve: Mildly calcified annulus. Trileaflet. - Mitral valve: Calcified annulus. There was trivial regurgitation. - Left atrium: The atrium was at the upper limits of normal in size. - Right atrium: Central venous pressure (est): 8 mm Hg. - Atrial septum: No defect or patent foramen ovale was identified. - Tricuspid valve: There was trivial regurgitation. - Pulmonary arteries: Systolic pressure could not be accurately estimated. - Pericardium, extracardiac: There was no pericardial  effusion.  Impressions:  - Mild LVH with LVEF approximately 45%, diffuse hypokinesis. Indeterminate diastolic function. Upper normal left atrial size. Mild mitral annular calcification with trivial mitral regurgitation. Trivial tricuspid regurgitation.  Assessment and Plan:  1. Cardiomyopathy, at this point suspect nonischemic with diffuse hypokinesis and LVEF 45%. We have made medication adjustments including a switch to Coreg from Lopressor. He is doing well without any significant breathlessness at this time. Plan to follow-up echocardiogram just prior to his next visit for reassessment.  2. COPD with tobacco abuse. We have discussed smoking cessation.  3. Essential hypertension, blood pressure is normal today.  4. Poorly controlled type 2 diabetes mellitus, undergoing recent medication changes by Dr. Nevada Crane. May need to consider endocrinology referral.  Current medicines were reviewed with the patient today.   Orders Placed This Encounter  Procedures  . ECHOCARDIOGRAM COMPLETE    Disposition: Follow-up in 2 months.  Signed, Satira Sark, MD, [facility] 02/26/2016 2:26 PM    [facility] at [place], [place], [place] [address] Phone: [phone]; Fax: [phone]

## 2016-03-05 DIAGNOSIS — I10 Essential (primary) hypertension: Secondary | ICD-10-CM | POA: Diagnosis not present

## 2016-03-05 DIAGNOSIS — E1165 Type 2 diabetes mellitus with hyperglycemia: Secondary | ICD-10-CM | POA: Diagnosis not present

## 2016-03-29 DIAGNOSIS — J449 Chronic obstructive pulmonary disease, unspecified: Secondary | ICD-10-CM | POA: Diagnosis not present

## 2016-04-09 DIAGNOSIS — E1165 Type 2 diabetes mellitus with hyperglycemia: Secondary | ICD-10-CM | POA: Diagnosis not present

## 2016-04-23 ENCOUNTER — Other Ambulatory Visit: Payer: Self-pay

## 2016-04-23 ENCOUNTER — Ambulatory Visit (INDEPENDENT_AMBULATORY_CARE_PROVIDER_SITE_OTHER): Payer: Medicare Other

## 2016-04-23 ENCOUNTER — Telehealth: Payer: Self-pay

## 2016-04-23 DIAGNOSIS — I429 Cardiomyopathy, unspecified: Secondary | ICD-10-CM

## 2016-04-23 NOTE — Telephone Encounter (Signed)
PATIENT NOTIFIED.

## 2016-04-23 NOTE — Telephone Encounter (Signed)
-----   Message from Samuel G McDowell, MD sent at 04/23/2016  3:39 PM EDT ----- Results reviewed. Please let him know that LVEF has normalized with medication adjustments. This is good news overall. A copy of this test should be forwarded to Zack Hall, MD. 

## 2016-04-23 NOTE — Telephone Encounter (Signed)
-----   Message from Jonelle Sidle, MD sent at 04/23/2016  3:39 PM EDT ----- Results reviewed. Please let him know that LVEF has normalized with medication adjustments. This is good news overall. A copy of this test should be forwarded to Dwana Melena, MD.

## 2016-05-07 DIAGNOSIS — R5383 Other fatigue: Secondary | ICD-10-CM | POA: Diagnosis not present

## 2016-05-09 ENCOUNTER — Ambulatory Visit (INDEPENDENT_AMBULATORY_CARE_PROVIDER_SITE_OTHER): Payer: Medicare Other | Admitting: Cardiology

## 2016-05-09 ENCOUNTER — Encounter: Payer: Self-pay | Admitting: Cardiology

## 2016-05-09 VITALS — BP 108/63 | HR 82 | Ht 68.0 in | Wt 173.0 lb

## 2016-05-09 DIAGNOSIS — E1165 Type 2 diabetes mellitus with hyperglycemia: Secondary | ICD-10-CM

## 2016-05-09 DIAGNOSIS — I429 Cardiomyopathy, unspecified: Secondary | ICD-10-CM | POA: Diagnosis not present

## 2016-05-09 DIAGNOSIS — I10 Essential (primary) hypertension: Secondary | ICD-10-CM | POA: Diagnosis not present

## 2016-05-09 DIAGNOSIS — N529 Male erectile dysfunction, unspecified: Secondary | ICD-10-CM | POA: Diagnosis not present

## 2016-05-09 NOTE — Progress Notes (Signed)
Cardiology Office Note  Date: 05/09/2016   ID: Chas Axel Brendel, DOB 1950/12/23, MRN [id number]  PCP: Dwana Melena, MD  Primary Cardiologist: Nona Dell, MD   Chief Complaint  Patient presents with  . Cardiomyopathy    History of Present Illness: Cody [name] is a 66 y.o. male last seen in January. He presents for a follow-up visit. States that he feels well, has more energy, has been trying to watch his diet by cutting back sugars and carbohydrates. He reports compliance with his medications.  Current cardiac regimen includes aspirin, Lotensin, Coreg, and HCTZ.  He had a recent follow-up echocardiogram that showed normalization of LVEF in the range of 55-60% following medication adjustments. We went over the results today.  Past Medical History:  Diagnosis Date  . Asthma   . COPD (chronic obstructive pulmonary disease) (HCC)   . Essential hypertension   . Nonischemic cardiomyopathy (HCC)   . Type 2 diabetes mellitus (HCC)     Past Surgical History:  Procedure Laterality Date  . AMPUTATION  01/01/2012   Procedure: AMPUTATION DIGIT;  Surgeon: Tami Ribas, MD;  Location: [facility] OR;  Service: Orthopedics;  Laterality: Right;  Right Ring and small finger revision of amputation, I&D and pinning  . APPENDECTOMY    . BACK SURGERY    . CLOSED REDUCTION FINGER WITH PERCUTANEOUS PINNING  01/01/2012   Procedure: CLOSED REDUCTION FINGER WITH PERCUTANEOUS PINNING;  Surgeon: Tami Ribas, MD;  Location: [facility] OR;  Service: Orthopedics;  Laterality: Right;    Current Outpatient Prescriptions  Medication Sig Dispense Refill  . albuterol (PROAIR HFA) 108 (90 Base) MCG/ACT inhaler Inhale 1-2 puffs into the lungs every 6 (six) hours as needed for wheezing or shortness of breath.    . ALPRAZolam (XANAX) 1 MG tablet Take 1 mg by mouth 3 (three) times daily.    Marland Kitchen aspirin EC 81 MG tablet Take 81 mg by mouth every morning.    . benazepril (LOTENSIN) 40 MG tablet Take 40 mg by mouth daily.    .  carvedilol (COREG) 6.25 MG tablet Take 1 tablet (6.25 mg total) by mouth 2 (two) times daily. 60 tablet 6  . gabapentin (NEURONTIN) 300 MG capsule Take 1 capsule by mouth 2 (two) times daily.    . hydrochlorothiazide (HYDRODIURIL) 25 MG tablet Take 1 tablet (25 mg total) by mouth daily. 30 tablet 1  . Insulin Glargine (TOUJEO SOLOSTAR) 300 UNIT/ML SOPN Inject 32 Units into the skin daily.    Marland Kitchen JARDIANCE 10 MG TABS tablet Take 1 tablet by mouth daily.    . metFORMIN (GLUCOPHAGE-XR) 500 MG 24 hr tablet Take 1,000 mg by mouth 2 (two) times daily.    Marland Kitchen omeprazole (PRILOSEC) 20 MG capsule Take 20 mg by mouth daily.    Letta Pate DELICA LANCETS FINE MISC     . ONETOUCH VERIO test strip     . oxyCODONE-acetaminophen (PERCOCET) 7.5-325 MG tablet Take 1 tablet by mouth 3 (three) times daily.    . rosuvastatin (CRESTOR) 5 MG tablet Take 2.5 mg by mouth at bedtime.     . sodium chloride (OCEAN) 0.65 % SOLN nasal spray Place 2 sprays into both nostrils as needed for congestion.    Marland Kitchen venlafaxine XR (EFFEXOR-XR) 150 MG 24 hr capsule Take 150 mg by mouth daily with breakfast.     No current facility-administered medications for this visit.    Allergies:  Penicillins   Social History: The patient  reports that he  has been smoking Cigarettes.  He started smoking about 48 years ago. He has been smoking about 1.50 packs per day. He has never used smokeless tobacco. He reports that he drinks about 6.0 oz of alcohol per week . He reports that he does not use drugs.   ROS:  Please see the history of present illness. Otherwise, complete review of systems is positive for erectile dysfunction.  All other systems are reviewed and negative.   Physical Exam: VS:  BP 108/63   Pulse 82   Ht 5\' 8"  (1.727 m)   Wt 173 lb (78.5 kg)   SpO2 98% Comment: on RA  BMI 26.30 kg/m , BMI Body mass index is 26.3 kg/m.  Wt Readings from Last 3 Encounters:  05/09/16 173 lb (78.5 kg)  02/26/16 176 lb (79.8 kg)  01/22/16 193 lb  (87.5 kg)    General: Patient appears comfortable at rest. HEENT: Conjunctiva and lids normal, oropharynx clear. Neck: Supple, no elevated JVP or carotid bruits, no thyromegaly. Lungs: Diminished breath sounds but no wheezing, nonlabored breathing at rest. Cardiac: Regular rate and rhythm, no S3 or significant systolic murmur, no pericardial rub. Abdomen: Soft, nontender, bowel sounds present, no guarding or rebound. Extremities: No pitting edema, distal pulses 2+. Skin: Warm and dry. Musculoskeletal: No kyphosis. Neuropsychiatric: Alert and oriented x3, affect grossly appropriate.  ECG: I personally reviewed the tracing from 01/05/2016 which showed sinus tachycardia with PVC, poor R wave progression rule out old anterior infarct pattern, left anterior fascicular block, and diffuse nonspecific ST-T changes.  Recent Labwork:  December 2017: Cholesterol 271, HDL 39, triglycerides 639, glucose 291, potassium 4.4, BUN 17, creatinine 1.1, AST 16, ALT 20, hemoglobin A1c 11.2, hemoglobin 15.4, platelets 198  Other Studies Reviewed Today:  Echocardiogram 04/23/2016: Study Conclusions  - Left ventricle: The cavity size was normal. Wall thickness was at   the upper limits of normal. Systolic function was normal. The   estimated ejection fraction was in the range of 55% to 60%. Wall   motion was normal; there were no regional wall motion   abnormalities. Doppler parameters are consistent with abnormal   left ventricular relaxation (grade 1 diastolic dysfunction). - Mitral valve: Calcified annulus. - Right atrium: Central venous pressure (est): 3 mm Hg. - Atrial septum: No defect or patent foramen ovale was identified. - Tricuspid valve: There was physiologic regurgitation. - Pulmonary arteries: PA peak pressure: 8 mm Hg (S). - Pericardium, extracardiac: There was no pericardial effusion.  Impressions:  - Upper normal LV wall thickness with LVEF 55-60% and grade 1   diastolic  dysfunction. LVEF has improved compared to the prior   study in November 2017.  Assessment and Plan:  1. Suspected nonischemic cardiomyopathy with improvement in LVEF from 45% up to normal range on medical therapy, documented by recent follow-up echocardiogram. He is doing very well symptomatically speaking, no changes were made today.  2. Erectile dysfunction. He would like to take Viagra and plans to discuss this with Dr. Margo Aye. No specific cardiac contraindication at this time. He is not on nitrates.  3. Type 2 diabetes mellitus, working on diet and weight loss, continues to follow with Dr. Margo Aye on medical therapy.  4. Essential hypertension, blood pressure is adequately controlled today. No changes were made.  Current medicines were reviewed with the patient today.  Disposition: Follow-up in 6 months.  Signed, Jonelle Sidle, MD, [facility] 05/09/2016 4:33 PM    [facility] at [facility] [address]  [address] [place], [place] [address] Phone: [phone]; Fax: [phone]

## 2016-05-09 NOTE — Patient Instructions (Signed)

## 2016-07-01 ENCOUNTER — Emergency Department (HOSPITAL_COMMUNITY): Payer: Medicare Other

## 2016-07-01 ENCOUNTER — Emergency Department (HOSPITAL_COMMUNITY)
Admission: EM | Admit: 2016-07-01 | Discharge: 2016-07-01 | Disposition: A | Discharge status: Home / Self Care | Payer: Medicare Other | Attending: Emergency Medicine | Admitting: Emergency Medicine

## 2016-07-01 ENCOUNTER — Encounter (HOSPITAL_COMMUNITY): Payer: Self-pay | Admitting: Emergency Medicine

## 2016-07-01 DIAGNOSIS — J45909 Unspecified asthma, uncomplicated: Secondary | ICD-10-CM | POA: Insufficient documentation

## 2016-07-01 DIAGNOSIS — F17210 Nicotine dependence, cigarettes, uncomplicated: Secondary | ICD-10-CM | POA: Diagnosis not present

## 2016-07-01 DIAGNOSIS — Y92009 Unspecified place in unspecified non-institutional (private) residence as the place of occurrence of the external cause: Secondary | ICD-10-CM | POA: Diagnosis not present

## 2016-07-01 DIAGNOSIS — J449 Chronic obstructive pulmonary disease, unspecified: Secondary | ICD-10-CM | POA: Diagnosis not present

## 2016-07-01 DIAGNOSIS — W231XXA Caught, crushed, jammed, or pinched between stationary objects, initial encounter: Secondary | ICD-10-CM | POA: Insufficient documentation

## 2016-07-01 DIAGNOSIS — S68110A Complete traumatic metacarpophalangeal amputation of right index finger, initial encounter: Secondary | ICD-10-CM | POA: Diagnosis not present

## 2016-07-01 DIAGNOSIS — I10 Essential (primary) hypertension: Secondary | ICD-10-CM | POA: Diagnosis not present

## 2016-07-01 DIAGNOSIS — S6991XA Unspecified injury of right wrist, hand and finger(s), initial encounter: Secondary | ICD-10-CM | POA: Diagnosis present

## 2016-07-01 DIAGNOSIS — Y939 Activity, unspecified: Secondary | ICD-10-CM | POA: Diagnosis not present

## 2016-07-01 DIAGNOSIS — E119 Type 2 diabetes mellitus without complications: Secondary | ICD-10-CM | POA: Insufficient documentation

## 2016-07-01 DIAGNOSIS — Z7982 Long term (current) use of aspirin: Secondary | ICD-10-CM | POA: Diagnosis not present

## 2016-07-01 DIAGNOSIS — S62630B Displaced fracture of distal phalanx of right index finger, initial encounter for open fracture: Secondary | ICD-10-CM | POA: Diagnosis not present

## 2016-07-01 DIAGNOSIS — Y999 Unspecified external cause status: Secondary | ICD-10-CM | POA: Diagnosis not present

## 2016-07-01 DIAGNOSIS — Z794 Long term (current) use of insulin: Secondary | ICD-10-CM | POA: Diagnosis not present

## 2016-07-01 DIAGNOSIS — Z79899 Other long term (current) drug therapy: Secondary | ICD-10-CM | POA: Diagnosis not present

## 2016-07-01 DIAGNOSIS — S62639B Displaced fracture of distal phalanx of unspecified finger, initial encounter for open fracture: Secondary | ICD-10-CM

## 2016-07-01 MED ORDER — CEPHALEXIN 500 MG PO CAPS: 500.0000 mg | ORAL_CAPSULE | Freq: Four times a day (QID) | ORAL | 0 refills | Status: DC

## 2016-07-01 MED ORDER — OXYCODONE-ACETAMINOPHEN 5-325 MG PO TABS: 1.0000 | ORAL_TABLET | Freq: Four times a day (QID) | ORAL | 0 refills | Status: DC | PRN

## 2016-07-01 MED ORDER — LIDOCAINE HCL (PF) 1 % IJ SOLN
INTRAMUSCULAR | Status: AC
  Administered 2016-07-01: 5 mL via INTRAMUSCULAR
  Filled 2016-07-01: qty 5

## 2016-07-01 MED ORDER — CEFTRIAXONE SODIUM 1 G IJ SOLR
1.0000 g | Freq: Once | INTRAMUSCULAR | Status: AC
  Administered 2016-07-01: 1 g via INTRAMUSCULAR
  Filled 2016-07-01: qty 10

## 2016-07-01 NOTE — ED Notes (Signed)
Pt a & o x 4, vs stable, finger cleaned and dressing applied, RX/verbal/written discharge instructions given, verbalized understanding, pt ambulated off unit in good condition with steady gait

## 2016-07-01 NOTE — Discharge Instructions (Signed)
Keep dressing in place until followed up tomorrow by Dr. Romeo Apple.  Call his office in the morning to arrange a follow-up appointment for tomorrow. He is aware of your situation and once to see you then.

## 2016-07-01 NOTE — ED Triage Notes (Signed)
Patient states she got his finger caught in a plainer 45 minutes ago. Patient presents with missing tip of right index finger.

## 2016-07-01 NOTE — ED Provider Notes (Signed)
[facility]-EMERGENCY DEPT Provider Note   CSN: [id number] Arrival date & time: 07/01/16  1844     History   Chief Complaint Chief Complaint  Patient presents with  . Finger Injury    HPI Holdan Stucke Studer is a 66 y.o. male.  Patient is a 66 year old male with past medical history of COPD, cardiomyopathy, hypertension, diabetes. He presents for evaluation of a right index finger injury. He apparently got his finger in a planer at home, causing an amputation of the soft tissues of the tip of the finger. Last tetanus is up-to-date.   The history is provided by the patient.    Past Medical History:  Diagnosis Date  . Asthma   . COPD (chronic obstructive pulmonary disease) (HCC)   . Essential hypertension   . Nonischemic cardiomyopathy (HCC)   . Type 2 diabetes mellitus [facility])     Patient Active Problem List   Diagnosis Date Noted  . Respiratory failure with hypoxia (HCC) 01/05/2016  . Pulmonary edema 01/05/2016  . COPD with acute exacerbation (HCC) 01/05/2016  . HTN (hypertension) 01/05/2016  . DM (diabetes mellitus) (HCC) 01/05/2016  . Respiratory failure (HCC) 01/05/2016    Past Surgical History:  Procedure Laterality Date  . AMPUTATION  01/01/2012   Procedure: AMPUTATION DIGIT;  Surgeon: Tami Ribas, MD;  Location: [facility] OR;  Service: Orthopedics;  Laterality: Right;  Right Ring and small finger revision of amputation, I&D and pinning  . APPENDECTOMY    . BACK SURGERY    . CLOSED REDUCTION FINGER WITH PERCUTANEOUS PINNING  01/01/2012   Procedure: CLOSED REDUCTION FINGER WITH PERCUTANEOUS PINNING;  Surgeon: Tami Ribas, MD;  Location: [facility] OR;  Service: Orthopedics;  Laterality: Right;       Home Medications    Prior to Admission medications   Medication Sig Start Date End Date Taking? Authorizing Provider  ALPRAZolam Prudy Feeler) 1 MG tablet Take 1 mg by mouth 3 (three) times daily.   Yes [provider]  carvedilol (COREG) 6.25 MG tablet Take 1 tablet (6.25 mg  total) by mouth 2 (two) times daily. 01/22/16  Yes Jonelle Sidle, MD  gabapentin (NEURONTIN) 300 MG capsule Take 1 capsule by mouth 2 (two) times daily. 04/10/16  Yes [provider]  hydrochlorothiazide (HYDRODIURIL) 25 MG tablet Take 1 tablet (25 mg total) by mouth daily. 01/08/16  Yes Houston Siren, MD  Insulin Glargine (TOUJEO SOLOSTAR) 300 UNIT/ML SOPN Inject 32 Units into the skin daily.   Yes [provider]  JARDIANCE 10 MG TABS tablet Take 1 tablet by mouth daily. 04/22/16  Yes [provider]  metFORMIN (GLUCOPHAGE-XR) 500 MG 24 hr tablet Take 1,000 mg by mouth 2 (two) times daily.   Yes [provider]  oxyCODONE-acetaminophen (PERCOCET) 7.5-325 MG tablet Take 1 tablet by mouth 3 (three) times daily. 01/04/16  Yes [provider]  pioglitazone (ACTOS) 15 MG tablet Take 15 mg by mouth daily.   Yes [provider]  rosuvastatin (CRESTOR) 5 MG tablet Take 2.5 mg by mouth at bedtime.  02/15/16  Yes [provider]  venlafaxine XR (EFFEXOR-XR) 150 MG 24 hr capsule Take 150 mg by mouth daily with breakfast.   Yes [provider]  albuterol (PROAIR HFA) 108 (90 Base) MCG/ACT inhaler Inhale 1-2 puffs into the lungs every 6 (six) hours as needed for wheezing or shortness of breath.    [provider]  aspirin EC 81 MG tablet Take 81 mg by mouth every morning.  [provider]  benazepril (LOTENSIN) 40 MG tablet Take 40 mg by mouth daily.    [provider]  omeprazole (PRILOSEC) 20 MG capsule Take 20 mg by mouth daily.    [provider]  sodium chloride (OCEAN) 0.65 % SOLN nasal spray Place 2 sprays into both nostrils as needed for congestion.    [provider]    Family History Family History  Problem Relation Age of Onset  . Heart failure Mother   . Rheum arthritis Mother   . Heart attack Father   . Stroke Father     Social History Social History  Substance Use Topics    . Smoking status: Current Every Day Smoker    Packs/day: 1.50    Types: Cigarettes    Start date: 12/04/1967    Last attempt to quit: 01/05/2016  . Smokeless tobacco: Never Used  . Alcohol use 6.0 oz/week    10 Cans of beer per week     Comment: week     Allergies   Penicillins   Review of Systems Review of Systems  All other systems reviewed and are negative.    Physical Exam Updated Vital Signs Pulse 92   Temp 98.7 F (37.1 C) (Oral)   Resp 18   Ht 5\' 8"  (1.727 m)   Wt 79.4 kg (175 lb)   SpO2 97%   BMI 26.61 kg/m   Physical Exam  Constitutional: He is oriented to person, place, and time. He appears well-developed and well-nourished. No distress.  HENT:  Head: Normocephalic and atraumatic.  Neck: Normal range of motion. Neck supple.  Cardiovascular: Normal rate and regular rhythm.  Exam reveals no friction rub.   No murmur heard. Pulmonary/Chest: Effort normal. No respiratory distress.  Musculoskeletal: Normal range of motion. He exhibits no edema.  The right index finger is missing soft tissue from the tip of the finger extending through the nail (see photo).  Neurological: He is alert and oriented to person, place, and time. Coordination normal.  Skin: Skin is warm and dry. He is not diaphoretic.  Nursing note and vitals reviewed.        ED Treatments / Results  Labs (all labs ordered are listed, but only abnormal results are displayed) Labs Reviewed - No data to display  EKG  EKG Interpretation None       Radiology No results found.  Procedures Procedures (including critical care time)  Medications Ordered in ED Medications - No data to display   Initial Impression / Assessment and Plan / ED Course  I have reviewed the triage vital signs and the nursing notes.  Pertinent labs & imaging results that were available during my care of the patient were reviewed by me and considered in my medical decision making (see chart for  details).  X-rays show an open tuft fracture/fingertip amputation at the level of the mid fingernail. I've discussed this with Dr. Romeo Apple from orthopedics. He wants the wound irrigated, dressed, and to have the patient followed up by him tomorrow in the office. He will be started on antibiotics, pain medicine, and is to follow-up tomorrow.  Final Clinical Impressions(s) / ED Diagnoses   Final diagnoses:  None    New Prescriptions New Prescriptions   No medications on file     Geoffery Lyons, MD 07/01/16 2030

## 2016-07-02 ENCOUNTER — Ambulatory Visit (INDEPENDENT_AMBULATORY_CARE_PROVIDER_SITE_OTHER): Payer: Medicare Other | Admitting: Orthopedic Surgery

## 2016-07-02 ENCOUNTER — Encounter: Payer: Self-pay | Admitting: Orthopedic Surgery

## 2016-07-02 VITALS — BP 126/77 | HR 86 | Ht 67.0 in | Wt 169.0 lb

## 2016-07-02 DIAGNOSIS — S68129A Partial traumatic metacarpophalangeal amputation of unspecified finger, initial encounter: Secondary | ICD-10-CM

## 2016-07-02 DIAGNOSIS — S68119A Complete traumatic metacarpophalangeal amputation of unspecified finger, initial encounter: Secondary | ICD-10-CM

## 2016-07-02 NOTE — Progress Notes (Signed)
NEW PATIENT OFFICE VISIT    Chief Complaint  Patient presents with  . Follow-up    ER follow up on right index finger. DOI 07-01-16.    66 year old male 1 day history of injuring his right index finger. He has 2 fingers on that hand the ring and the small with prior amputations that were partial  On this occasion he was using a planer and on amputated the distal tip of his finger. It is a dorsal oblique type injury with more tissue volar than dorsal. We also note that the bone is right at the tip of the skin with no chance of closure or secondary healing intention.  He has minimal discomfort. He has good movement of the DIP joint. He has slight decreased sensation at the tip of the finger.    Review of Systems  Constitutional: Negative for chills, fever and malaise/fatigue.  Neurological: Positive for sensory change. Negative for tingling and focal weakness.     Past Medical History:  Diagnosis Date  . Asthma   . COPD (chronic obstructive pulmonary disease) (HCC)   . Essential hypertension   . Nonischemic cardiomyopathy (HCC)   . Type 2 diabetes mellitus (HCC)     Past Surgical History:  Procedure Laterality Date  . AMPUTATION  01/01/2012   Procedure: AMPUTATION DIGIT;  Surgeon: Tami Ribas, MD;  Location: [facility] OR;  Service: Orthopedics;  Laterality: Right;  Right Ring and small finger revision of amputation, I&D and pinning  . APPENDECTOMY    . BACK SURGERY    . CLOSED REDUCTION FINGER WITH PERCUTANEOUS PINNING  01/01/2012   Procedure: CLOSED REDUCTION FINGER WITH PERCUTANEOUS PINNING;  Surgeon: Tami Ribas, MD;  Location: [facility] OR;  Service: Orthopedics;  Laterality: Right;    Family History  Problem Relation Age of Onset  . Heart failure Mother   . Rheum arthritis Mother   . Heart attack Father   . Stroke Father    Social History  Substance Use Topics  . Smoking status: Current Every Day Smoker    Packs/day: 1.50    Types: Cigarettes    Start date: 12/04/1967     Last attempt to quit: 01/05/2016  . Smokeless tobacco: Never Used  . Alcohol use 6.0 oz/week    10 Cans of beer per week     Comment: week    BP 126/77   Pulse 86   Ht 5\' 7"  (1.702 m)   Wt 169 lb (76.7 kg)   BMI 26.47 kg/m   Physical Exam  Constitutional: He appears well-developed and well-nourished.  Vital signs have been reviewed and are stable. Gen. appearance the patient is well-developed and well-nourished with normal grooming and hygiene. The patient is oriented 3 with normal mood and affect.  Vitals reviewed.   Ortho Exam His gait and station were normal  His right hand shows a partial amputation of the small and ring finger with decreased sensation there chronic. He is index finger has a dorsal oblique distal tip amputation with more tissue volar than proximal. The nail was also damaged and it's an oblique type cut. He has intact range of motion the DIP joint with normal extension flexion PIP joint normal but stable in flexion tendon extensor tendon strength normal. Skin as described color and capillary refill remain normal sensation shows sensory decrease the tip with painful palpation.  His left hand looks normal   Encounter Diagnosis  Name Primary?  . Traumatic amputation of fingertip, initial encounter Yes  PLAN:   The x-ray shows a fracture of the distal tip of the phalanx and is flush with the skin so this will require some bony removal to get closure  Patient is encouraged to start his antibiotics and follow-up for surgery on Friday and then postop 1 week for dressing change

## 2016-07-03 ENCOUNTER — Encounter: Payer: Self-pay | Admitting: *Deleted

## 2016-07-03 ENCOUNTER — Encounter (HOSPITAL_COMMUNITY): Payer: Self-pay

## 2016-07-03 ENCOUNTER — Other Ambulatory Visit: Payer: Self-pay | Admitting: *Deleted

## 2016-07-03 ENCOUNTER — Encounter (HOSPITAL_COMMUNITY)
Admission: RE | Admit: 2016-07-03 | Discharge: 2016-07-03 | Disposition: A | Discharge status: Home / Self Care | Payer: Medicare Other | Source: Ambulatory Visit | Attending: Orthopedic Surgery | Admitting: Orthopedic Surgery

## 2016-07-03 DIAGNOSIS — Z794 Long term (current) use of insulin: Secondary | ICD-10-CM | POA: Diagnosis not present

## 2016-07-03 DIAGNOSIS — S68119A Complete traumatic metacarpophalangeal amputation of unspecified finger, initial encounter: Secondary | ICD-10-CM

## 2016-07-03 DIAGNOSIS — S68110A Complete traumatic metacarpophalangeal amputation of right index finger, initial encounter: Secondary | ICD-10-CM | POA: Diagnosis not present

## 2016-07-03 DIAGNOSIS — I252 Old myocardial infarction: Secondary | ICD-10-CM | POA: Diagnosis not present

## 2016-07-03 DIAGNOSIS — J449 Chronic obstructive pulmonary disease, unspecified: Secondary | ICD-10-CM | POA: Diagnosis not present

## 2016-07-03 DIAGNOSIS — Z79899 Other long term (current) drug therapy: Secondary | ICD-10-CM | POA: Diagnosis not present

## 2016-07-03 DIAGNOSIS — Z89021 Acquired absence of right finger(s): Secondary | ICD-10-CM | POA: Diagnosis not present

## 2016-07-03 DIAGNOSIS — S68620A Partial traumatic transphalangeal amputation of right index finger, initial encounter: Secondary | ICD-10-CM | POA: Diagnosis not present

## 2016-07-03 DIAGNOSIS — F17210 Nicotine dependence, cigarettes, uncomplicated: Secondary | ICD-10-CM | POA: Diagnosis not present

## 2016-07-03 DIAGNOSIS — I10 Essential (primary) hypertension: Secondary | ICD-10-CM | POA: Diagnosis not present

## 2016-07-03 DIAGNOSIS — I428 Other cardiomyopathies: Secondary | ICD-10-CM | POA: Diagnosis not present

## 2016-07-03 DIAGNOSIS — E1165 Type 2 diabetes mellitus with hyperglycemia: Secondary | ICD-10-CM | POA: Diagnosis not present

## 2016-07-03 DIAGNOSIS — W278XXA Contact with other nonpowered hand tool, initial encounter: Secondary | ICD-10-CM | POA: Diagnosis not present

## 2016-07-03 DIAGNOSIS — Z7982 Long term (current) use of aspirin: Secondary | ICD-10-CM | POA: Diagnosis not present

## 2016-07-03 DIAGNOSIS — E119 Type 2 diabetes mellitus without complications: Secondary | ICD-10-CM | POA: Diagnosis not present

## 2016-07-03 DIAGNOSIS — E785 Hyperlipidemia, unspecified: Secondary | ICD-10-CM | POA: Diagnosis not present

## 2016-07-03 HISTORY — DX: Chronic pulmonary edema: J81.1

## 2016-07-03 HISTORY — DX: Acute myocardial infarction, unspecified: I21.9

## 2016-07-03 HISTORY — DX: Respiratory failure, unspecified, unspecified whether with hypoxia or hypercapnia: J96.90

## 2016-07-03 LAB — CBC WITH DIFFERENTIAL/PLATELET
Basophils Absolute: 0 10*3/uL (ref 0.0–0.1)
Basophils Relative: 1 %
EOS PCT: 5 %
Eosinophils Absolute: 0.3 10*3/uL (ref 0.0–0.7)
HCT: 46.4 % (ref 39.0–52.0)
Hemoglobin: 16 g/dL (ref 13.0–17.0)
LYMPHS ABS: 1.9 10*3/uL (ref 0.7–4.0)
LYMPHS PCT: 35 %
MCH: 32.3 pg (ref 26.0–34.0)
MCHC: 34.5 g/dL (ref 30.0–36.0)
MCV: 93.5 fL (ref 78.0–100.0)
MONO ABS: 0.4 10*3/uL (ref 0.1–1.0)
Monocytes Relative: 8 %
Neutro Abs: 2.8 10*3/uL (ref 1.7–7.7)
Neutrophils Relative %: 51 %
PLATELETS: 240 10*3/uL (ref 150–400)
RBC: 4.96 MIL/uL (ref 4.22–5.81)
RDW: 13.3 % (ref 11.5–15.5)
WBC: 5.4 10*3/uL (ref 4.0–10.5)

## 2016-07-03 LAB — BASIC METABOLIC PANEL
Anion gap: 10 (ref 5–15)
BUN: 14 mg/dL (ref 6–20)
CO2: 27 mmol/L (ref 22–32)
Calcium: 9 mg/dL (ref 8.9–10.3)
Chloride: 97 mmol/L — ABNORMAL LOW (ref 101–111)
Creatinine, Ser: 1.18 mg/dL (ref 0.61–1.24)
GFR calc Af Amer: >60 mL/min (ref >60)
GLUCOSE: 248 mg/dL — AB (ref 65–99)
Potassium: 3.4 mmol/L — ABNORMAL LOW (ref 3.5–5.1)
Sodium: 134 mmol/L — ABNORMAL LOW (ref 135–145)

## 2016-07-03 LAB — SURGICAL PCR SCREEN
MRSA, PCR: NEGATIVE
Staphylococcus aureus: NEGATIVE

## 2016-07-03 NOTE — Patient Instructions (Signed)
Cody [name]  07/03/2016     @PREFPERIOPPHARMACY @   Your procedure is scheduled on  07/04/2016   Report to [facility] at  1030  A.M.  Call this number if you have problems the morning of surgery:  [phone]   Remember:  Do not eat food or drink liquids after midnight.  Take these medicines the morning of surgery with A SIP OF WATER  Xanax, coreg, neurontin, prilosec, oxycodone, effexor. Use your inhaler before you come.   Do not wear jewelry, make-up or nail polish.  Do not wear lotions, powders, or perfumes, or deoderant.  Do not shave 48 hours prior to surgery.  Men may shave face and neck.  Do not bring valuables to the hospital.  [facility] is not responsible for any belongings or valuables.  Contacts, dentures or bridgework may not be worn into surgery.  Leave your suitcase in the car.  After surgery it may be brought to your room.  For patients admitted to the hospital, discharge time will be determined by your treatment team.  Patients discharged the day of surgery will not be allowed to drive home.   Name and phone number of your driver:   family Special instructions:  None  Please read over the following fact sheets that you were given. Anesthesia Post-op Instructions and Care and Recovery After Surgery      Traumatic Toe Amputation A traumatic toe amputation is when a person loses part or all of a toe because of an accident or injury. This condition is a medical emergency. It needs to be treated right away to prevent more damage to the toe and to save the lost part of the toe, if that is possible. What are the causes? This condition usually results from an accident that involves:  A car.  Power tools.  Factory work.  Farm or Risk manager. What are the signs or symptoms? Symptoms of this condition include:  Bleeding.  Pain.  Damage to surrounding tissues, such as bones, muscles, tendons, and skin. How is this diagnosed? This  condition is diagnosed with a physical exam. During the exam your health care provider will determine how severe the injury is and the best way to treat it. X-rays may be done to check for damage to the surrounding bones and tissues. How is this treated? This condition is treated by cleaning the wound thoroughly and by using medicines for pain. Additional treatment depends on the type of injury that you have and how bad it is:  If just the tip of your toe was removed, it may be treated by applying a protective bandage (dressing) to the toe and doing regular cleaning.  If the injury is more severe, a portion of skin may need to be taken from another part of the body (graft) and attached to the wound site until it heals.  If a large portion of the toe was removed, treatment may involve performing a surgery to reattach the toe (replantation). Follow these instructions at home: Medicines   Take over-the-counter and prescription medicines only as told by your health care provider.  If you were prescribed an antibiotic medicine, use it as told by your health care provider. Do not stop using the antibiotic even if you start to feel better. Wound care   Follow instructions from your health care provider about how to take care of your wound. Make sure you:  Wash your hands with soap and  water before you change your dressing. If soap and water are not available, use hand sanitizer.  Change your dressing as told by your health care provider.  Leave stitches (sutures), skin glue, or adhesive strips in place. These skin closures may need to stay in place for 2 weeks or longer. If adhesive strip edges start to loosen and curl up, you may trim the loose edges. Do not remove adhesive strips completely unless your health care provider tells you to do that.  Check your wound every day for signs of infection. Check for:  Redness, swelling, or pain.  Fluid or blood.  Warmth.  Pus or a bad  smell. General instructions   Do exercises to strengthen your toe and foot as told by your health care provider.  Keep your affected foot raised above the level of your heart when resting. Contact a health care provider if:  Your wound does not seem to be healing well.  You have redness, swelling, or pain around your wound.  You have fluid or blood coming from your wound.  Your wound feels warm to the touch.  You have pus or a bad smell coming from your wound.  You have a fever. Get help right away if:  You have redness extending from your wound. Summary  A traumatic toe amputation is when a person loses part or all of a toe because of an accident or injury.  This condition needs to be treated right away in order to prevent more damage to the toe and to save the lost part of the toe, if that is possible.  Follow instructions from your health care provider about how to take care of your wound. This information is not intended to replace advice given to you by your health care provider. Make sure you discuss any questions you have with your health care provider. Document Released: 12/18/2004 Document Revised: 01/31/2016 Document Reviewed: 01/31/2016 Elsevier Interactive Patient Education  2017 [employer]  Traumatic Finger Amputation A traumatic finger amputation is when a person loses part or all of a finger because of an accident or injury. This condition is a medical emergency. It needs to be treated right away to prevent more damage to the finger and to save the lost part of the finger, if that is possible. What are the causes? This condition usually results from an accident that involves:  A car.  Power tools.  Factory work.  Farm or Risk manager. What are the signs or symptoms? Symptoms of this condition include:  Bleeding.  Pain.  Damage to surrounding tissues, such as bones, muscles, tendons, and skin. How is this diagnosed? This condition is diagnosed  with a physical exam. During the exam, your health care provider will determine how severe the injury is and the best way to treat it. X-rays may be done to check for damage to the surrounding bones and tissues. How is this treated? This condition is treated by cleaning the wound thoroughly and with medicines for pain. Additional treatment depends on the type of injury that you have and how bad it is:  If only the tip of your finger was removed, treatment may involve placing a protective bandage (dressing) over the wound and cleaning the wound regularly.  If the injury is severe, a portion of skin may be taken from another part of the body (graft) and attached to the wound site until the wound heals.  If a large portion of the finger was cut off, treatment may  include a surgical procedure to reattach the finger (replantation). Follow these instructions at home: Medicines   Take over-the-counter and prescription medicines only as told by your health care provider.  If you were prescribed an antibiotic medicine, use it as told by your health care provider. Do not stop using the antibiotic even if your condition improves.  Do not drive or use heavy machinery while taking prescription pain medicine. Wound care   Follow instructions from your health care provider about how to take care of your wound. Make sure you:  Wash your hands with soap and water before you change your dressing. If soap and water are not available, use hand sanitizer.  Change your dressing as told by your health care provider.  Leave stitches (sutures), skin glue, or adhesive strips in place. These skin closures may need to stay in place for 2 weeks or longer. If adhesive strip edges start to loosen and curl up, you may trim the loose edges. Do not remove adhesive strips completely unless your health care provider tells you to do that.  Check your wound every day for signs of infection. Check for:  Redness, swelling, or  pain.  Fluid or blood.  Warmth.  Pus or a bad smell. General instructions   Do exercises to strengthen your finger and hand as directed by your health care provider.  Keep your hand raised above the level of your heart when resting. Contact a health care provider if:  Your wound does not seem to be healing well.  You have redness, swelling, or pain around your wound.  You have fluid or blood coming from your wound.  Your wound feels warm to the touch.  You have pus or a bad smell coming from your wound.  You have a fever. Get help right away if:  You have redness spreading or extending from your wound. Summary  A traumatic finger amputation is when a person loses part or all of a finger because of an accident or injury.  This condition is diagnosed with a physical exam. During the exam your health care provider will determine how severe the injury is and the best way to treat it.  Follow instructions from your health care provider about how to take care of your wound. This information is not intended to replace advice given to you by your health care provider. Make sure you discuss any questions you have with your health care provider. Document Released: 04/07/2001 Document Revised: 02/14/2016 Document Reviewed: 02/14/2016 Elsevier Interactive Patient Education  2017 [employer]. PATIENT INSTRUCTIONS POST-ANESTHESIA  IMMEDIATELY FOLLOWING SURGERY:  Do not drive or operate machinery for the first twenty four hours after surgery.  Do not make any important decisions for twenty four hours after surgery or while taking narcotic pain medications or sedatives.  If you develop intractable nausea and vomiting or a severe headache please notify your doctor immediately.  FOLLOW-UP:  Please make an appointment with your surgeon as instructed. You do not need to follow up with anesthesia unless specifically instructed to do so.  WOUND CARE INSTRUCTIONS (if applicable):  Keep a dry  clean dressing on the anesthesia/puncture wound site if there is drainage.  Once the wound has quit draining you may leave it open to air.  Generally you should leave the bandage intact for twenty four hours unless there is drainage.  If the epidural site drains for more than 36-48 hours please call the anesthesia department.  QUESTIONS?:  Please feel free to call  your physician or the hospital operator if you have any questions, and they will be happy to assist you.

## 2016-07-03 NOTE — Progress Notes (Signed)
SURGICAL PRE AUTHORIZATION NOT REQUIRED PER PATIENT PLAN  REFERENCE  MARK F. 07/03/16 9:48AM c 

## 2016-07-04 ENCOUNTER — Encounter (HOSPITAL_COMMUNITY)
Admission: RE | Disposition: A | Discharge status: Home / Self Care | Payer: Self-pay | Source: Ambulatory Visit | Attending: Orthopedic Surgery

## 2016-07-04 ENCOUNTER — Ambulatory Visit (HOSPITAL_COMMUNITY): Payer: Medicare Other | Admitting: Anesthesiology

## 2016-07-04 ENCOUNTER — Encounter (HOSPITAL_COMMUNITY): Payer: Self-pay | Admitting: *Deleted

## 2016-07-04 ENCOUNTER — Ambulatory Visit (HOSPITAL_COMMUNITY)
Admission: RE | Admit: 2016-07-04 | Discharge: 2016-07-04 | Disposition: A | Discharge status: Home / Self Care | Payer: Medicare Other | Source: Ambulatory Visit | Attending: Orthopedic Surgery | Admitting: Orthopedic Surgery

## 2016-07-04 DIAGNOSIS — J449 Chronic obstructive pulmonary disease, unspecified: Secondary | ICD-10-CM | POA: Insufficient documentation

## 2016-07-04 DIAGNOSIS — Z7982 Long term (current) use of aspirin: Secondary | ICD-10-CM | POA: Diagnosis not present

## 2016-07-04 DIAGNOSIS — S62660B Nondisplaced fracture of distal phalanx of right index finger, initial encounter for open fracture: Secondary | ICD-10-CM

## 2016-07-04 DIAGNOSIS — I428 Other cardiomyopathies: Secondary | ICD-10-CM | POA: Diagnosis not present

## 2016-07-04 DIAGNOSIS — I252 Old myocardial infarction: Secondary | ICD-10-CM | POA: Diagnosis not present

## 2016-07-04 DIAGNOSIS — W278XXA Contact with other nonpowered hand tool, initial encounter: Secondary | ICD-10-CM | POA: Insufficient documentation

## 2016-07-04 DIAGNOSIS — Z89021 Acquired absence of right finger(s): Secondary | ICD-10-CM | POA: Diagnosis not present

## 2016-07-04 DIAGNOSIS — Z79899 Other long term (current) drug therapy: Secondary | ICD-10-CM | POA: Diagnosis not present

## 2016-07-04 DIAGNOSIS — Z794 Long term (current) use of insulin: Secondary | ICD-10-CM | POA: Diagnosis not present

## 2016-07-04 DIAGNOSIS — S68620A Partial traumatic transphalangeal amputation of right index finger, initial encounter: Secondary | ICD-10-CM | POA: Diagnosis not present

## 2016-07-04 DIAGNOSIS — E119 Type 2 diabetes mellitus without complications: Secondary | ICD-10-CM | POA: Insufficient documentation

## 2016-07-04 DIAGNOSIS — F17210 Nicotine dependence, cigarettes, uncomplicated: Secondary | ICD-10-CM | POA: Insufficient documentation

## 2016-07-04 DIAGNOSIS — I10 Essential (primary) hypertension: Secondary | ICD-10-CM | POA: Insufficient documentation

## 2016-07-04 HISTORY — PX: AMPUTATION: SHX166

## 2016-07-04 LAB — GLUCOSE, CAPILLARY
Glucose-Capillary: 182 mg/dL — ABNORMAL HIGH (ref 65–99)
Glucose-Capillary: 148 mg/dL — ABNORMAL HIGH (ref 65–99)

## 2016-07-04 SURGERY — AMPUTATION DIGIT
Anesthesia: Regional | Laterality: Right

## 2016-07-04 MED ORDER — VANCOMYCIN HCL IN DEXTROSE 1-5 GM/200ML-% IV SOLN
INTRAVENOUS | Status: AC
  Filled 2016-07-04: qty 200

## 2016-07-04 MED ORDER — LIDOCAINE HCL (PF) 0.5 % IJ SOLN
INTRAMUSCULAR | Status: AC
  Filled 2016-07-04: qty 50

## 2016-07-04 MED ORDER — MIDAZOLAM HCL 2 MG/2ML IJ SOLN
1.0000 mg | INTRAMUSCULAR | Status: AC
  Administered 2016-07-04: 2 mg via INTRAVENOUS

## 2016-07-04 MED ORDER — BUPIVACAINE HCL (PF) 0.5 % IJ SOLN
INTRAMUSCULAR | Status: AC
  Filled 2016-07-04: qty 30

## 2016-07-04 MED ORDER — GLYCOPYRROLATE 0.2 MG/ML IJ SOLN
INTRAMUSCULAR | Status: AC
  Filled 2016-07-04: qty 1

## 2016-07-04 MED ORDER — GLYCOPYRROLATE 0.2 MG/ML IJ SOLN
0.2000 mg | Freq: Once | INTRAMUSCULAR | Status: AC
  Administered 2016-07-04: 0.2 mg via INTRAVENOUS

## 2016-07-04 MED ORDER — DEXTROSE 5 % IV SOLN
INTRAVENOUS | Status: DC | PRN
  Administered 2016-07-04: 13:00:00 via INTRAVENOUS

## 2016-07-04 MED ORDER — SODIUM CHLORIDE 0.9% FLUSH
INTRAVENOUS | Status: AC
  Filled 2016-07-04: qty 10

## 2016-07-04 MED ORDER — POVIDONE-IODINE 10 % EX SWAB: 2.0000 "application " | Freq: Once | CUTANEOUS | Status: DC

## 2016-07-04 MED ORDER — PROPOFOL 500 MG/50ML IV EMUL
INTRAVENOUS | Status: DC | PRN
Start: 2016-07-04 — End: 2016-07-04
  Administered 2016-07-04: 35 ug/kg/min via INTRAVENOUS

## 2016-07-04 MED ORDER — SODIUM CHLORIDE 0.9 % IR SOLN
Status: DC | PRN
  Administered 2016-07-04: 1000 mL

## 2016-07-04 MED ORDER — LIDOCAINE HCL (PF) 0.5 % IJ SOLN
INTRAMUSCULAR | Status: DC | PRN
  Administered 2016-07-04: 250 mg via INTRAVENOUS

## 2016-07-04 MED ORDER — CHLORHEXIDINE GLUCONATE 4 % EX LIQD: 60.0000 mL | Freq: Once | CUTANEOUS | Status: DC

## 2016-07-04 MED ORDER — MIDAZOLAM HCL 2 MG/2ML IJ SOLN
INTRAMUSCULAR | Status: AC
  Filled 2016-07-04: qty 2

## 2016-07-04 MED ORDER — IPRATROPIUM-ALBUTEROL 0.5-2.5 (3) MG/3ML IN SOLN
RESPIRATORY_TRACT | Status: AC
  Filled 2016-07-04: qty 3

## 2016-07-04 MED ORDER — VANCOMYCIN HCL IN DEXTROSE 1-5 GM/200ML-% IV SOLN
1000.0000 mg | INTRAVENOUS | Status: AC
  Administered 2016-07-04: 1000 mg via INTRAVENOUS

## 2016-07-04 MED ORDER — FENTANYL CITRATE (PF) 100 MCG/2ML IJ SOLN
INTRAMUSCULAR | Status: AC
  Filled 2016-07-04: qty 4

## 2016-07-04 MED ORDER — LACTATED RINGERS IV SOLN
INTRAVENOUS | Status: DC
Start: 2016-07-04 — End: 2016-07-04
  Administered 2016-07-04: 12:00:00 via INTRAVENOUS

## 2016-07-04 MED ORDER — MIDAZOLAM HCL 5 MG/5ML IJ SOLN
INTRAMUSCULAR | Status: DC | PRN
  Administered 2016-07-04: 2 mg via INTRAVENOUS

## 2016-07-04 MED ORDER — IPRATROPIUM-ALBUTEROL 0.5-2.5 (3) MG/3ML IN SOLN
3.0000 mL | Freq: Once | RESPIRATORY_TRACT | Status: AC
  Administered 2016-07-04: 3 mL via RESPIRATORY_TRACT

## 2016-07-04 MED ORDER — BUPIVACAINE HCL (PF) 0.5 % IJ SOLN
INTRAMUSCULAR | Status: DC | PRN
  Administered 2016-07-04: 10 mL

## 2016-07-04 MED ORDER — PROPOFOL 10 MG/ML IV BOLUS
INTRAVENOUS | Status: AC
Start: 2016-07-04 — End: ?
  Filled 2016-07-04: qty 40

## 2016-07-04 MED ORDER — FENTANYL CITRATE (PF) 100 MCG/2ML IJ SOLN
INTRAMUSCULAR | Status: DC | PRN
  Administered 2016-07-04 (×2): 50 ug via INTRAVENOUS

## 2016-07-04 SURGICAL SUPPLY — 36 items
BAG HAMPER (MISCELLANEOUS) ×3 IMPLANT
BANDAGE ELASTIC 2 VELCRO NS LF (GAUZE/BANDAGES/DRESSINGS) ×3 IMPLANT
BANDAGE ESMARK 4X12 BL STRL LF (DISPOSABLE) ×1 IMPLANT
BLADE SAW RECIPROCATING 77.5 (BLADE) ×3 IMPLANT
BNDG CMPR 12X4 ELC STRL LF (DISPOSABLE) ×1
BNDG CONFORM 2 STRL LF (GAUZE/BANDAGES/DRESSINGS) ×3 IMPLANT
BNDG ESMARK 4X12 BLUE STRL LF (DISPOSABLE) ×3
BNDG GAUZE ELAST 4 BULKY (GAUZE/BANDAGES/DRESSINGS) ×6 IMPLANT
CHLORAPREP W/TINT 26ML (MISCELLANEOUS) ×3 IMPLANT
CLOTH BEACON ORANGE TIMEOUT ST (SAFETY) ×3 IMPLANT
COVER LIGHT HANDLE STERIS (MISCELLANEOUS) ×6 IMPLANT
CUFF TOURNIQUET SINGLE 18IN (TOURNIQUET CUFF) ×3 IMPLANT
DRSG XEROFORM 1X8 (GAUZE/BANDAGES/DRESSINGS) ×2 IMPLANT
ELECT REM PT RETURN 9FT ADLT (ELECTROSURGICAL) ×3
ELECTRODE REM PT RTRN 9FT ADLT (ELECTROSURGICAL) ×1 IMPLANT
GLOVE BIOGEL PI IND STRL 7.0 (GLOVE) ×1 IMPLANT
GLOVE BIOGEL PI INDICATOR 7.0 (GLOVE) ×2
GLOVE SKINSENSE NS SZ8.0 LF (GLOVE) ×2
GLOVE SKINSENSE STRL SZ8.0 LF (GLOVE) ×1 IMPLANT
GLOVE SS N UNI LF 8.5 STRL (GLOVE) ×3 IMPLANT
GOWN STRL REUS W/TWL LRG LVL3 (GOWN DISPOSABLE) ×12 IMPLANT
GOWN STRL REUS W/TWL XL LVL3 (GOWN DISPOSABLE) ×3 IMPLANT
HEMOSTAT SURGICEL 4X8 (HEMOSTASIS) ×3 IMPLANT
KIT ROOM TURNOVER APOR (KITS) ×3 IMPLANT
MANIFOLD NEPTUNE II (INSTRUMENTS) ×3 IMPLANT
NS IRRIG 1000ML POUR BTL (IV SOLUTION) ×3 IMPLANT
PACK BASIC LIMB (CUSTOM PROCEDURE TRAY) ×3 IMPLANT
PAD ARMBOARD 7.5X6 YLW CONV (MISCELLANEOUS) ×3 IMPLANT
SET BASIN LINEN APH (SET/KITS/TRAYS/PACK) ×3 IMPLANT
SOL PREP PROV IODINE SCRUB 4OZ (MISCELLANEOUS) ×3 IMPLANT
SPONGE GAUZE 2X2 8PLY STER LF (GAUZE/BANDAGES/DRESSINGS) ×2
SPONGE GAUZE 2X2 8PLY STRL LF (GAUZE/BANDAGES/DRESSINGS) ×4 IMPLANT
SPONGE LAP 18X18 X RAY DECT (DISPOSABLE) ×3 IMPLANT
SUT BONE WAX W31G (SUTURE) ×3 IMPLANT
SUT ETHILON 3 0 FSL (SUTURE) ×3 IMPLANT
SYR BULB IRRIGATION 50ML (SYRINGE) ×3 IMPLANT

## 2016-07-04 NOTE — Transfer of Care (Signed)
Immediate Anesthesia Transfer of Care Note  Patient: Cody [name]  Procedure(s) Performed: Procedure(s): AMPUTATION RIGHT INDEX FINGER (Right)  Patient Location: PACU  Anesthesia Type:Bier block  Level of Consciousness: awake and patient cooperative  Airway & Oxygen Therapy: Patient Spontanous Breathing and Patient connected to face mask oxygen  Post-op Assessment: Report given to RN, Post -op Vital signs reviewed and stable and Patient moving all extremities  Post vital signs: Reviewed and stable  Last Vitals:  Vitals:   07/04/16 1100 07/04/16 1200  BP: 110/81 125/89  Pulse:    Resp: 17 15  Temp:      Last Pain:  Vitals:   07/04/16 1059  TempSrc: Oral      Patients Stated Pain Goal: 6 (07/04/16 1059)  Complications: No apparent anesthesia complications

## 2016-07-04 NOTE — Interval H&P Note (Signed)
History and Physical Interval Note:  07/04/2016 12:36 PM  Cody [name]  has presented today for surgery, with the diagnosis of PARTIAL AMPUATION RIGHT INDEX FINGER  The various methods of treatment have been discussed with the patient and family. After consideration of risks, benefits and other options for treatment, the patient has consented to  Procedure(s): AMPUTATION DIGIT (Right) as a surgical intervention .  The patient's history has been reviewed, patient examined, no change in status, stable for surgery.  I have reviewed the patient's chart and labs.  Questions were answered to the patient's satisfaction.     Fuller Canada

## 2016-07-04 NOTE — Op Note (Signed)
Operative report for 07/04/2016 revision of a partial amputation of the fingertip of the right index finger  66 year old male who amputated part of his fingertip with the planer. Initial treatment in the ER. Sent to [place] for follow-up.  This was a dorsal oblique type injury with bone right at the end of the skin  He presented for revision  Preop diagnosis partial fingertip amputation right index finger  Postop same  Surgeon Romeo Apple  Procedure revision of partial indentation right index finger  No assistants  Regional anesthesia  Details of procedure in the preop area over the patient was identified surgical site confirmed and marked  He was taken to surgery had block  After sterile prep and drape and timeout  We reevaluated the finger and the bone was right at the edge of the laceration  We therefore isolated the bone with blunt dissection removed about 3 mm of it leaving enough to support the remaining nail bed. We trimmed the nail preserved it washed it soaked it in while we were irrigating the nail continue to soak  We then sewed the nail back to the bed including multiple sutures to bring the skin up over to the tip of the nail.  We then closed after giving a digital block with plain Marcaine  Sterile dressing was applied  Patient be discharged [id number]

## 2016-07-04 NOTE — Discharge Instructions (Signed)

## 2016-07-04 NOTE — Brief Op Note (Signed)
07/04/2016  1:38 PM  PATIENT:  Cody Schaumann [name]  66 y.o. male  PRE-OPERATIVE DIAGNOSIS:  PARTIAL AMPUATION RIGHT INDEX FINGER  POST-OPERATIVE DIAGNOSIS:  PARTIAL AMPUATION RIGHT INDEX FINGER  PROCEDURE:  Procedure(s): AMPUTATION RIGHT INDEX FINGER (Right)   Findings: dorsal oblique with bone even with wound.  SURGEON:  Surgeon(s) and Role:    * Vickki Hearing, MD - Primary  PHYSICIAN ASSISTANT:   ASSISTANTS: none   ANESTHESIA:   regional  EBL:  Total I/O In: 800 [I.V.:800] Out: -   BLOOD ADMINISTERED:none  DRAINS: none   LOCAL MEDICATIONS USED:  MARCAINE     SPECIMEN:  No Specimen  DISPOSITION OF SPECIMEN:  N/A  COUNTS:  YES  TOURNIQUET:   Total Tourniquet Time Documented: Upper Arm (Right) - 40 minutes Total: Upper Arm (Right) - 40 minutes   DICTATION: .Reubin Milan Dictation  PLAN OF CARE: Discharge to home after PACU  PATIENT DISPOSITION:  PACU - hemodynamically stable.   Delay start of Pharmacological VTE agent (>24hrs) due to surgical blood loss or risk of bleeding: not applicable

## 2016-07-04 NOTE — Anesthesia Procedure Notes (Signed)
Anesthesia Regional Block: Bier block (IV Regional)   Pre-Anesthetic Checklist: ,, timeout performed, Correct Patient, Correct Site, Correct Laterality, Correct Procedure,, site marked, surgical consent,,  Laterality: Right     Needles:  Injection technique: Single-shot      Needle Gauge: 20     Additional Needles:   Procedures:,,,,,,, Esmarch exsanguination, single tourniquet utilized, #20gu IV placed  Narrative:  Start time: 07/04/2016 12:55 PM End time: 07/04/2016 12:57 PM CRNA: Despina Hidden

## 2016-07-04 NOTE — Anesthesia Postprocedure Evaluation (Signed)
Anesthesia Post Note  Patient: Cody [name]  Procedure(s) Performed: Procedure(s) (LRB): AMPUTATION RIGHT INDEX FINGER (Right)  Patient location during evaluation: Short Stay Anesthesia Type: Bier Block Level of consciousness: awake and alert Pain management: satisfactory to patient Vital Signs Assessment: post-procedure vital signs reviewed and stable Respiratory status: spontaneous breathing Cardiovascular status: stable Anesthetic complications: no     Last Vitals:  Vitals:   07/04/16 1400 07/04/16 1408  BP: (!) 128/92 123/76  Pulse: 73 70  Resp: 14 18  Temp: 36.6 C 36.7 C    Last Pain:  Vitals:   07/04/16 1408  TempSrc: Oral  PainSc:                  Minerva Areola

## 2016-07-04 NOTE — Addendum Note (Signed)
Addendum  created 07/04/16 1413 by Franco Nones, CRNA   Sign clinical note

## 2016-07-04 NOTE — Interval H&P Note (Signed)
History and Physical Interval Note:  07/04/2016 12:35 PM  Cody [name]  has presented today for surgery, with the diagnosis of PARTIAL AMPUATION RIGHT INDEX FINGER  The various methods of treatment have been discussed with the patient and family. After consideration of risks, benefits and other options for treatment, the patient has consented to  Procedure(s): AMPUTATION DIGIT (Right) as a surgical intervention .  The patient's history has been reviewed, patient examined, no change in status, stable for surgery.  I have reviewed the patient's chart and labs.  Questions were answered to the patient's satisfaction.     Fuller Canada

## 2016-07-04 NOTE — Anesthesia Preprocedure Evaluation (Addendum)
Anesthesia Evaluation  Patient identified by MRN, date of birth, ID band Patient awake    Reviewed: Allergy & Precautions, H&P , NPO status , Patient's Chart, lab work & pertinent test results, reviewed documented beta blocker date and time   Airway Mallampati: I  TM Distance: >3 FB Neck ROM: Full    Dental   Pulmonary COPD (hx resp failure), Current Smoker,    + rhonchi        Cardiovascular hypertension, Pt. on medications and Pt. on home beta blockers + Past MI   Rhythm:Regular Rate:Normal     Neuro/Psych    GI/Hepatic negative GI ROS,   Endo/Other  diabetes, Type 2  Renal/GU      Musculoskeletal   Abdominal   Peds  Hematology   Anesthesia Other Findings   Reproductive/Obstetrics                             Anesthesia Physical Anesthesia Plan  ASA: III  Anesthesia Plan: Bier Block   Post-op Pain Management:    Induction: Intravenous  Airway Management Planned: Simple Face Mask  Additional Equipment:   Intra-op Plan:   Post-operative Plan:   Informed Consent: I have reviewed the patients History and Physical, chart, labs and discussed the procedure including the risks, benefits and alternatives for the proposed anesthesia with the patient or authorized representative who has indicated his/her understanding and acceptance.     Plan Discussed with:   Anesthesia Plan Comments: (Possible Gen LMA discussed and he agrees.)        Anesthesia Quick Evaluation

## 2016-07-04 NOTE — H&P (Signed)
History and physical for outpatient surgery         Chief Complaint  Patient presents with  . Follow-up      ER follow up on right index finger. DOI 07-01-16.      66 year old male 1 day history of injuring his right index finger. He has 2 fingers on that hand the ring and the small with prior amputations that were partial   On this occasion he was using a planer and on amputated the distal tip of his finger. It is a dorsal oblique type injury with more tissue volar than dorsal. We also note that the bone is right at the tip of the skin with no chance of closure or secondary healing intention.   He has minimal discomfort. He has good movement of the DIP joint. He has slight decreased sensation at the tip of the finger.     Review of Systems  Constitutional: Negative for chills, fever and malaise/fatigue.  Neurological: Positive for sensory change. Negative for tingling and focal weakness.            Past Medical History:  Diagnosis Date  . Asthma    . COPD (chronic obstructive pulmonary disease) (HCC)    . Essential hypertension    . Nonischemic cardiomyopathy (HCC)    . Type 2 diabetes mellitus (HCC)             Past Surgical History:  Procedure Laterality Date  . AMPUTATION   01/01/2012    Procedure: AMPUTATION DIGIT;  Surgeon: Tami Ribas, MD;  Location: [facility] OR;  Service: Orthopedics;  Laterality: Right;  Right Ring and small finger revision of amputation, I&D and pinning  . APPENDECTOMY      . BACK SURGERY      . CLOSED REDUCTION FINGER WITH PERCUTANEOUS PINNING   01/01/2012    Procedure: CLOSED REDUCTION FINGER WITH PERCUTANEOUS PINNING;  Surgeon: Tami Ribas, MD;  Location: [facility] OR;  Service: Orthopedics;  Laterality: Right;           Family History  Problem Relation Age of Onset  . Heart failure Mother    . Rheum arthritis Mother    . Heart attack Father    . Stroke Father      Social History  Substance Use Topics  . Smoking status: Current Every Day  Smoker      Packs/day: 1.50      Types: Cigarettes      Start date: 12/04/1967      Last attempt to quit: 01/05/2016  . Smokeless tobacco: Never Used  . Alcohol use 6.0 oz/week       10 Cans of beer per week         Comment: week      BP 126/77   Pulse 86   Ht 5\' 7"  (1.702 m)   Wt 169 lb (76.7 kg)   BMI 26.47 kg/m    Physical Exam  Constitutional: He appears well-developed and well-nourished.  Vital signs have been reviewed and are stable. Gen. appearance the patient is well-developed and well-nourished with normal grooming and hygiene. The patient is oriented 3 with normal mood and affect.  Vitals reviewed.     Ortho Exam His gait and station were normal   His right hand shows a partial amputation of the small and ring finger with decreased sensation there chronic. He is index finger has a dorsal oblique distal tip amputation with more tissue volar than proximal. The nail was also  damaged and it's an oblique type cut. He has intact range of motion the DIP joint with normal extension flexion PIP joint normal but stable in flexion tendon extensor tendon strength normal. Skin as described color and capillary refill remain normal sensation shows sensory decrease the tip with painful palpation.   His left hand looks normal         Encounter Diagnosis  Name Primary?  . Traumatic amputation of fingertip, initial encounter Yes        PLAN:    The x-ray shows a fracture of the distal tip of the phalanx and is flush with the skin so this will require some bony removal to get closure   Patient is encouraged to start his antibiotics and follow-up for surgery on Friday and then postop 1 week for dressing change

## 2016-07-04 NOTE — Anesthesia Postprocedure Evaluation (Signed)
Anesthesia Post Note  Patient: Cody [name]  Procedure(s) Performed: Procedure(s) (LRB): AMPUTATION RIGHT INDEX FINGER (Right)  Patient location during evaluation: PACU Anesthesia Type: Bier Block Level of consciousness: awake and patient cooperative Pain management: pain level controlled Vital Signs Assessment: post-procedure vital signs reviewed and stable Respiratory status: spontaneous breathing, nonlabored ventilation and respiratory function stable Cardiovascular status: blood pressure returned to baseline Postop Assessment: no signs of nausea or vomiting Anesthetic complications: no     Last Vitals:  Vitals:   07/04/16 1100 07/04/16 1200  BP: 110/81 125/89  Pulse:    Resp: 17 15  Temp:      Last Pain:  Vitals:   07/04/16 1059  TempSrc: Oral                 [name] J

## 2016-07-08 ENCOUNTER — Encounter (HOSPITAL_COMMUNITY): Payer: Self-pay | Admitting: Orthopedic Surgery

## 2016-07-09 DIAGNOSIS — E1165 Type 2 diabetes mellitus with hyperglycemia: Secondary | ICD-10-CM | POA: Diagnosis not present

## 2016-07-09 DIAGNOSIS — I10 Essential (primary) hypertension: Secondary | ICD-10-CM | POA: Diagnosis not present

## 2016-07-09 DIAGNOSIS — J449 Chronic obstructive pulmonary disease, unspecified: Secondary | ICD-10-CM | POA: Diagnosis not present

## 2016-07-09 DIAGNOSIS — E785 Hyperlipidemia, unspecified: Secondary | ICD-10-CM | POA: Diagnosis not present

## 2016-07-11 ENCOUNTER — Ambulatory Visit (INDEPENDENT_AMBULATORY_CARE_PROVIDER_SITE_OTHER): Payer: Self-pay | Admitting: Orthopedic Surgery

## 2016-07-11 ENCOUNTER — Encounter: Payer: Self-pay | Admitting: Orthopedic Surgery

## 2016-07-11 DIAGNOSIS — S68119D Complete traumatic metacarpophalangeal amputation of unspecified finger, subsequent encounter: Secondary | ICD-10-CM

## 2016-07-11 DIAGNOSIS — Z4889 Encounter for other specified surgical aftercare: Secondary | ICD-10-CM

## 2016-07-11 DIAGNOSIS — S68129D Partial traumatic metacarpophalangeal amputation of unspecified finger, subsequent encounter: Secondary | ICD-10-CM

## 2016-07-11 MED ORDER — OXYCODONE-ACETAMINOPHEN 5-325 MG PO TABS: 1.0000 | ORAL_TABLET | Freq: Four times a day (QID) | ORAL | 0 refills | Status: DC | PRN

## 2016-07-11 NOTE — Progress Notes (Signed)
Post op   Chief Complaint  Patient presents with  . Follow-up    amputation Rt index finger, DOI 07/04/16    Encounter Diagnoses  Name Primary?  Marland Kitchen Aftercare following surgery Yes  . Traumatic amputation of fingertip, subsequent encounter     Status post revision of partial amputation right index finger. Wound check today. Wound looks good. There are no signs of necrosis  We refilled his pain medication follow-up for suture removal in a few days  Meds ordered this encounter  Medications  . oxyCODONE-acetaminophen (PERCOCET) 5-325 MG tablet    Sig: Take 1-2 tablets by mouth every 6 (six) hours as needed.    Dispense:  28 tablet    Refill:  0

## 2016-07-15 ENCOUNTER — Ambulatory Visit: Payer: Medicare Other | Admitting: Orthopedic Surgery

## 2016-07-16 ENCOUNTER — Encounter: Payer: Self-pay | Admitting: Orthopedic Surgery

## 2016-07-16 ENCOUNTER — Ambulatory Visit (INDEPENDENT_AMBULATORY_CARE_PROVIDER_SITE_OTHER): Payer: Self-pay | Admitting: Orthopedic Surgery

## 2016-07-16 DIAGNOSIS — S68119D Complete traumatic metacarpophalangeal amputation of unspecified finger, subsequent encounter: Secondary | ICD-10-CM

## 2016-07-16 DIAGNOSIS — S68129D Partial traumatic metacarpophalangeal amputation of unspecified finger, subsequent encounter: Secondary | ICD-10-CM

## 2016-07-16 DIAGNOSIS — Z4889 Encounter for other specified surgical aftercare: Secondary | ICD-10-CM

## 2016-07-16 NOTE — Patient Instructions (Signed)
Soak finger daily for 15 minutes with peroxide, warm water and a teaspoon of table salt  Continue antibiotics  Follow-up in 3 weeks

## 2016-07-16 NOTE — Progress Notes (Signed)
Postop follow-up  Chief Complaint  Patient presents with  . Follow-up    POST OP RIGHT INDEX FINGER AMPUTATION, 07/04/16    Right index finger revision of partial amputation. We took the sutures out today everything looks good. He was placed in a splint. He was given instructions to soak the finger in peroxide and come back in 3 weeks he will continue antibiotics

## 2016-07-23 ENCOUNTER — Telehealth: Payer: Self-pay | Admitting: Orthopaedic Surgery

## 2016-07-23 DIAGNOSIS — R0683 Snoring: Secondary | ICD-10-CM | POA: Diagnosis not present

## 2016-07-23 DIAGNOSIS — E785 Hyperlipidemia, unspecified: Secondary | ICD-10-CM | POA: Diagnosis not present

## 2016-07-23 DIAGNOSIS — Z720 Tobacco use: Secondary | ICD-10-CM | POA: Diagnosis not present

## 2016-07-23 DIAGNOSIS — I10 Essential (primary) hypertension: Secondary | ICD-10-CM | POA: Diagnosis not present

## 2016-07-23 DIAGNOSIS — E1165 Type 2 diabetes mellitus with hyperglycemia: Secondary | ICD-10-CM | POA: Diagnosis not present

## 2016-07-23 MED ORDER — CEPHALEXIN 500 MG PO CAPS: 500.0000 mg | ORAL_CAPSULE | Freq: Four times a day (QID) | ORAL | 0 refills | Status: DC

## 2016-07-23 NOTE — Telephone Encounter (Signed)
Spoke with patient's daughter designated contact, and she states that she will let him know his prescription is ready to be picked up.

## 2016-07-23 NOTE — Telephone Encounter (Signed)
Tammy from [facility] called and said Cody [name] needs to get refill on Keflex 500 mgs.  She did not receive electronic refill on the 07/16/16.       Sig: Take 1 capsule (500 mg total) by mouth 4 (four) times daily.

## 2016-08-05 ENCOUNTER — Encounter: Payer: Self-pay | Admitting: Orthopedic Surgery

## 2016-08-05 ENCOUNTER — Ambulatory Visit (INDEPENDENT_AMBULATORY_CARE_PROVIDER_SITE_OTHER): Payer: Self-pay | Admitting: Orthopedic Surgery

## 2016-08-05 DIAGNOSIS — Z4889 Encounter for other specified surgical aftercare: Secondary | ICD-10-CM

## 2016-08-05 DIAGNOSIS — S68119D Complete traumatic metacarpophalangeal amputation of unspecified finger, subsequent encounter: Secondary | ICD-10-CM

## 2016-08-05 DIAGNOSIS — S68129D Partial traumatic metacarpophalangeal amputation of unspecified finger, subsequent encounter: Secondary | ICD-10-CM

## 2016-08-05 NOTE — Progress Notes (Signed)
Chief Complaint  Patient presents with  . Follow-up    POST OP RIGHT INDEX FINGER AMPUTATION, 07/04/16    Postop right index finger revision of amputation  Patient has a little hypersensitivity but his nail is growing back his finger looks good no signs of infection and keep soaking until all the rough tissue is gone and follow-upAs needed

## 2016-08-18 DIAGNOSIS — R05 Cough: Secondary | ICD-10-CM | POA: Diagnosis not present

## 2016-08-27 DIAGNOSIS — E1165 Type 2 diabetes mellitus with hyperglycemia: Secondary | ICD-10-CM | POA: Diagnosis not present

## 2016-08-27 DIAGNOSIS — E785 Hyperlipidemia, unspecified: Secondary | ICD-10-CM | POA: Diagnosis not present

## 2016-08-27 DIAGNOSIS — Z720 Tobacco use: Secondary | ICD-10-CM | POA: Diagnosis not present

## 2016-08-27 DIAGNOSIS — I10 Essential (primary) hypertension: Secondary | ICD-10-CM | POA: Diagnosis not present

## 2016-09-11 ENCOUNTER — Other Ambulatory Visit (HOSPITAL_BASED_OUTPATIENT_CLINIC_OR_DEPARTMENT_OTHER): Payer: Self-pay

## 2016-09-11 DIAGNOSIS — R0683 Snoring: Secondary | ICD-10-CM

## 2016-09-13 DIAGNOSIS — I10 Essential (primary) hypertension: Secondary | ICD-10-CM | POA: Diagnosis not present

## 2016-09-13 DIAGNOSIS — E1165 Type 2 diabetes mellitus with hyperglycemia: Secondary | ICD-10-CM | POA: Diagnosis not present

## 2016-09-13 DIAGNOSIS — J449 Chronic obstructive pulmonary disease, unspecified: Secondary | ICD-10-CM | POA: Diagnosis not present

## 2016-10-01 ENCOUNTER — Ambulatory Visit: Payer: Medicare Other | Attending: Internal Medicine | Admitting: Neurology

## 2016-10-01 DIAGNOSIS — Z79899 Other long term (current) drug therapy: Secondary | ICD-10-CM | POA: Diagnosis not present

## 2016-10-01 DIAGNOSIS — Z7982 Long term (current) use of aspirin: Secondary | ICD-10-CM | POA: Diagnosis not present

## 2016-10-01 DIAGNOSIS — G4733 Obstructive sleep apnea (adult) (pediatric): Secondary | ICD-10-CM | POA: Diagnosis not present

## 2016-10-01 DIAGNOSIS — Z794 Long term (current) use of insulin: Secondary | ICD-10-CM | POA: Insufficient documentation

## 2016-10-01 DIAGNOSIS — R0683 Snoring: Secondary | ICD-10-CM | POA: Diagnosis not present

## 2016-10-04 NOTE — Procedures (Signed)
[facility] Langdon Crosson A. Gerilyn Pilgrim, MD     [email or web address]             NOCTURNAL POLYSOMNOGRAPHY   LOCATION: [facility]  Patient Name: Cody [name] Date: 09/29/2016 Gender: Male D.O.B: 10/06/1950 Age (years): 65 Referring Provider: Salley Scarlet Height (inches): 64 Interpreting Physician: Beryle Beams MD, ABSM Weight (lbs): 240 RPSGT: Alfonso Ellis BMI: 41 MRN: [id number] Neck Size: 16.50 CLINICAL INFORMATION Sleep Study Type: NPSG  Indication for sleep study: N/A  Epworth Sleepiness Score: 8  SLEEP STUDY TECHNIQUE As per the AASM Manual for the Scoring of Sleep and Associated Events v2.3 (April 2016) with a hypopnea requiring 4% desaturations.  The channels recorded and monitored were frontal, central and occipital EEG, electrooculogram (EOG), submentalis EMG (chin), nasal and oral airflow, thoracic and abdominal wall motion, anterior tibialis EMG, snore microphone, electrocardiogram, and pulse oximetry.  MEDICATIONS Medications self-administered by patient taken the night of the study : N/A  Current Outpatient Prescriptions:  .  albuterol (PROAIR HFA) 108 (90 Base) MCG/ACT inhaler, Inhale 1-2 puffs into the lungs every 6 (six) hours as needed for wheezing or shortness of breath., Disp: , Rfl:  .  ALPRAZolam (XANAX) 1 MG tablet, Take 1 mg by mouth 3 (three) times daily., Disp: , Rfl:  .  aspirin EC 81 MG tablet, Take 81 mg by mouth every morning., Disp: , Rfl:  .  carvedilol (COREG) 6.25 MG tablet, Take 1 tablet (6.25 mg total) by mouth 2 (two) times daily., Disp: 60 tablet, Rfl: 6 .  cephALEXin (KEFLEX) 500 MG capsule, Take 1 capsule (500 mg total) by mouth 4 (four) times daily., Disp: 28 capsule, Rfl: 0 .  cephALEXin (KEFLEX) 500 MG capsule, Take 1 capsule (500 mg total) by mouth 4 (four) times daily., Disp: 40 capsule, Rfl: 0 .  gabapentin (NEURONTIN) 300 MG capsule, Take 1 capsule by mouth 2 (two) times daily., Disp: , Rfl:  .   hydrochlorothiazide (HYDRODIURIL) 25 MG tablet, Take 1 tablet (25 mg total) by mouth daily., Disp: 30 tablet, Rfl: 1 .  Insulin Glargine (TOUJEO SOLOSTAR) 300 UNIT/ML SOPN, Inject 32 Units into the skin daily., Disp: , Rfl:  .  JARDIANCE 10 MG TABS tablet, Take 1 tablet by mouth daily., Disp: , Rfl:  .  metFORMIN (GLUCOPHAGE-XR) 500 MG 24 hr tablet, Take 1,000 mg by mouth 2 (two) times daily., Disp: , Rfl:  .  omeprazole (PRILOSEC) 20 MG capsule, Take 20 mg by mouth daily., Disp: , Rfl:  .  oxyCODONE-acetaminophen (PERCOCET) 5-325 MG tablet, Take 1-2 tablets by mouth every 6 (six) hours as needed., Disp: 28 tablet, Rfl: 0 .  pioglitazone (ACTOS) 15 MG tablet, Take 15 mg by mouth daily., Disp: , Rfl:  .  rosuvastatin (CRESTOR) 5 MG tablet, Take 2.5 mg by mouth daily. , Disp: , Rfl:  .  sodium chloride (OCEAN) 0.65 % SOLN nasal spray, Place 2 sprays into both nostrils as needed for congestion., Disp: , Rfl:  .  venlafaxine XR (EFFEXOR-XR) 150 MG 24 hr capsule, Take 150 mg by mouth daily with breakfast., Disp: , Rfl:   SLEEP ARCHITECTURE The study was initiated at 11:11:38 PM and ended at 5:26:21 AM.  Sleep onset time was 23.7 minutes and the sleep efficiency was 84.3%. The total sleep time was 316.0 minutes.  Stage REM latency was 76.5 minutes.  The patient spent 3.48% of the night in stage N1 sleep, 48.26% in stage N2 sleep, 30.38% in stage N3 and 17.88% in REM.  Alpha intrusion was absent.  Supine sleep was 4.43%.  RESPIRATORY PARAMETERS The overall apnea/hypopnea index (AHI) was 8.9 per hour. There were 6 total apneas, including 5 obstructive, 0 central and 1 mixed apneas. There were 41 hypopneas and 0 RERAs.  The AHI during Stage REM sleep was 46.7 per hour.  AHI while supine was 0.0 per hour.  The mean oxygen saturation was 92.62%. The minimum SpO2 during sleep was 82.00%.  Moderate snoring was noted during this study.  CARDIAC DATA The 2 lead EKG demonstrated sinus rhythm. The  mean heart rate was N/A beats per minute. Other EKG findings include: None. LEG MOVEMENT DATA The total PLMS were 10 with a resulting PLMS index of 1.90. Associated arousal with leg movement index was 1.5.  IMPRESSIONS - Mild obstructive sleep apnea is observed during this recording. The degree of severity does not require a positive pressure treatment.   Argie Ramming, MD Diplomate, American Board of Sleep Medicine.

## 2016-10-23 ENCOUNTER — Encounter: Payer: Medicare Other | Attending: Internal Medicine | Admitting: Nutrition

## 2016-10-23 DIAGNOSIS — E1165 Type 2 diabetes mellitus with hyperglycemia: Secondary | ICD-10-CM

## 2016-10-23 DIAGNOSIS — E785 Hyperlipidemia, unspecified: Secondary | ICD-10-CM

## 2016-10-23 DIAGNOSIS — Z794 Long term (current) use of insulin: Secondary | ICD-10-CM

## 2016-10-23 DIAGNOSIS — I10 Essential (primary) hypertension: Secondary | ICD-10-CM | POA: Diagnosis not present

## 2016-10-23 DIAGNOSIS — E669 Obesity, unspecified: Secondary | ICD-10-CM

## 2016-10-23 DIAGNOSIS — Z713 Dietary counseling and surveillance: Secondary | ICD-10-CM | POA: Insufficient documentation

## 2016-10-23 DIAGNOSIS — E118 Type 2 diabetes mellitus with unspecified complications: Secondary | ICD-10-CM | POA: Insufficient documentation

## 2016-10-23 DIAGNOSIS — IMO0002 Reserved for concepts with insufficient information to code with codable children: Secondary | ICD-10-CM

## 2016-10-23 NOTE — Patient Instructions (Addendum)
Goals 1. Follow My Plate  Eat 3-4 carb choices per meal Increase fiber in diet. 2. Walk daily 30 minutes 3. Cut out processed foods 4. Cut out Dr. Alcus Dad. Drink only water Get A1C down to 7%.

## 2016-10-23 NOTE — Progress Notes (Signed)
Diabetes Self-Management Education  Visit Type: First/Initial  Appt. Start Time: 1330 Appt. End Time: 1500  10/23/2016  Mr. Cody [name], identified by name and date of birth, is a 66 y.o. male with a diagnosis of Diabetes: Type 2. He lives with his sons. He is retired and farms. He has had DM for longer than 10 yrs. Sees Dr. Margo Aye for his DM. Takes 50 units Toujeo, Actos, Jardiance, Metformin  ER 500 mg 2 pills BID ?   He notes he is extremely tired in am when he first gets up.   He notes he eats 2-3 meals per day but his biggest problem is drinking Dr. Reino Boyce BS is 130-170's in am and then 200's before lunch and dinner and bedtime. Just started drinking more water. Works on farm for exercise. Willing to start walking more for exercise and cut out Dr. Reino James Current diet is excessive in CHO as evidenced by A1C 9.8%.  TG elevated at 239 mg/dl also. He is engaged to make dietary changes to improve his BS.   ASSESSMENT      Diabetes Self-Management Education - 10/23/16 1340      Visit Information   Visit Type First/Initial     Initial Visit   Diabetes Type Type 2   Are you currently following a meal plan? No   Are you taking your medications as prescribed? Yes   Date Diagnosed 2002     Health Coping   How would you rate your overall health? Good     Psychosocial Assessment   Patient Belief/Attitude about Diabetes Motivated to manage diabetes   Self-care barriers None   Self-management support Doctor's office   Other persons present Patient   Patient Concerns Nutrition/Meal planning;Medication;Monitoring;Healthy Lifestyle;Glycemic Control   Special Needs None   Preferred Learning Style Visual;Auditory;No preference indicated   Learning Readiness Ready   How often do you need to have someone help you when you read instructions, pamphlets, or other written materials from your doctor or pharmacy? 1 - Never   What is the last grade level you completed in school? 11     Pre-Education Assessment   Patient understands the diabetes disease and treatment process. Needs Instruction   Patient understands incorporating nutritional management into lifestyle. Needs Instruction   Patient undertands incorporating physical activity into lifestyle. Needs Instruction   Patient understands using medications safely. Needs Instruction   Patient understands monitoring blood glucose, interpreting and using results Needs Instruction   Patient understands prevention, detection, and treatment of acute complications. Needs Instruction   Patient understands prevention, detection, and treatment of chronic complications. Needs Instruction   Patient understands how to develop strategies to address psychosocial issues. Needs Instruction   Patient understands how to develop strategies to promote health/change behavior. Needs Instruction     Complications   Last HgB A1C per patient/outside source 9.8 %   How often do you check your blood sugar? 3-4 times/day   Fasting Blood glucose range (mg/dL) 161-096   Postprandial Blood glucose range (mg/dL) 045-409   Number of hypoglycemic episodes per month 0   Number of hyperglycemic episodes per week 10   Can you tell when your blood sugar is high? No   What do you do if your blood sugar is high? nothing   Have you had a dilated eye exam in the past 12 months? No   Have you had a dental exam in the past 12 months? No   Are you checking your feet? No  Dietary Intake   Breakfast Eggs. hot dog or Ensure;    Lunch Bologna sandwich and Dr. Reino Advait.   Dinner [employer], corn, Dr. Reino Janziel   Beverage(s) Water , soda     Exercise   Exercise Type ADL's     Patient Education   Previous Diabetes Education No   Disease state  Factors that contribute to the development of diabetes;Explored patient's options for treatment of their diabetes   Nutrition management  Role of diet in the treatment of diabetes and the relationship between the three  main macronutrients and blood glucose level;Food label reading, portion sizes and measuring food.;Carbohydrate counting;Meal timing in regards to the patients' current diabetes medication.;Information on hints to eating out and maintain blood glucose control.;Meal options for control of blood glucose level and chronic complications.   Physical activity and exercise  Role of exercise on diabetes management, blood pressure control and cardiac health.   Medications Taught/reviewed insulin injection, site rotation, insulin storage and needle disposal.;Reviewed patients medication for diabetes, action, purpose, timing of dose and side effects.;Reviewed medication adjustment guidelines for hyperglycemia and sick days.   Monitoring Taught/evaluated SMBG meter.;Purpose and frequency of SMBG.;Interpreting lab values - A1C, lipid, urine microalbumina.;Identified appropriate SMBG and/or A1C goals.;Taught/discussed recording of test results and interpretation of SMBG.   Acute complications Taught treatment of hypoglycemia - the 15 rule.;Discussed and identified patients' treatment of hyperglycemia.   Chronic complications Relationship between chronic complications and blood glucose control;Assessed and discussed foot care and prevention of foot problems;Lipid levels, blood glucose control and heart disease;Nephropathy, what it is, prevention of, the use of ACE, ARB's and early detection of through urine microalbumia.;Retinopathy and reason for yearly dilated eye exams;Reviewed with patient heart disease, higher risk of, and prevention   Psychosocial adjustment Role of stress on diabetes   Personal strategies to promote health Lifestyle issues that need to be addressed for better diabetes care     Individualized Goals (developed by patient)   Nutrition General guidelines for healthy choices and portions discussed;Adjust meds/carbs with exercise as discussed;Follow meal plan discussed   Physical Activity Exercise 3-5  times per week;30 minutes per day   Medications take my medication as prescribed   Monitoring  test my blood glucose as discussed;test blood glucose pre and post meals as discussed   Reducing Risk examine blood glucose patterns     Post-Education Assessment   Patient understands the diabetes disease and treatment process. Needs Review   Patient understands incorporating nutritional management into lifestyle. Needs Review   Patient undertands incorporating physical activity into lifestyle. Needs Review   Patient understands using medications safely. Needs Review   Patient understands monitoring blood glucose, interpreting and using results Needs Review   Patient understands prevention, detection, and treatment of acute complications. Needs Review   Patient understands prevention, detection, and treatment of chronic complications. Needs Review   Patient understands how to develop strategies to address psychosocial issues. Needs Review   Patient understands how to develop strategies to promote health/change behavior. Needs Review     Outcomes   Expected Outcomes Demonstrated interest in learning. Expect positive outcomes   Future DMSE 4-6 wks   Program Status Completed      Individualized Plan for Diabetes Self-Management Training:   Learning Objective:  Patient will have a greater understanding of diabetes self-management. Patient education plan is to attend individual and/or group sessions per assessed needs and concerns.   Plan:   Patient Instructions  Goals 1. Follow My Plate  Eat 3-4  carb choices per meal Increase fiber in diet. 2. Walk daily 30 minutes 3. Cut out processed foods 4. Cut out Dr. Alcus Dad. Drink only water Get A1C down to 7%.    Expected Outcomes:  Demonstrated interest in learning. Expect positive outcomes  Education material provided: Living Well with Diabetes, Food label handouts, A1C conversion sheet, Meal plan card, My Plate and Carbohydrate counting  sheet  If problems or questions, patient to contact team via:  Phone and Email  Future DSME appointment: 4-6 wks    Would recommend to not exceed 1000 mg of Metformin ER based on Standards of Care.

## 2016-11-19 DIAGNOSIS — I10 Essential (primary) hypertension: Secondary | ICD-10-CM | POA: Diagnosis not present

## 2016-11-19 DIAGNOSIS — E1165 Type 2 diabetes mellitus with hyperglycemia: Secondary | ICD-10-CM | POA: Diagnosis not present

## 2016-11-19 DIAGNOSIS — E785 Hyperlipidemia, unspecified: Secondary | ICD-10-CM | POA: Diagnosis not present

## 2016-11-20 ENCOUNTER — Ambulatory Visit: Payer: Medicare Other | Admitting: Nutrition

## 2016-12-17 DIAGNOSIS — R0781 Pleurodynia: Secondary | ICD-10-CM | POA: Diagnosis not present

## 2016-12-25 DIAGNOSIS — M25531 Pain in right wrist: Secondary | ICD-10-CM | POA: Diagnosis not present

## 2017-03-02 ENCOUNTER — Other Ambulatory Visit: Payer: Self-pay | Admitting: *Deleted

## 2017-03-02 MED ORDER — CARVEDILOL 6.25 MG PO TABS: 6.2500 mg | ORAL_TABLET | Freq: Two times a day (BID) | ORAL | 0 refills | Status: DC

## 2017-04-02 DIAGNOSIS — R0781 Pleurodynia: Secondary | ICD-10-CM | POA: Diagnosis not present

## 2017-04-29 DIAGNOSIS — M545 Low back pain: Secondary | ICD-10-CM | POA: Diagnosis not present

## 2017-04-29 DIAGNOSIS — R0781 Pleurodynia: Secondary | ICD-10-CM | POA: Diagnosis not present

## 2017-04-29 DIAGNOSIS — E1165 Type 2 diabetes mellitus with hyperglycemia: Secondary | ICD-10-CM | POA: Diagnosis not present

## 2017-04-29 DIAGNOSIS — G8929 Other chronic pain: Secondary | ICD-10-CM | POA: Diagnosis not present

## 2017-04-29 DIAGNOSIS — R0782 Intercostal pain: Secondary | ICD-10-CM | POA: Diagnosis not present

## 2017-05-01 DIAGNOSIS — J449 Chronic obstructive pulmonary disease, unspecified: Secondary | ICD-10-CM | POA: Diagnosis not present

## 2017-05-01 DIAGNOSIS — G479 Sleep disorder, unspecified: Secondary | ICD-10-CM | POA: Diagnosis not present

## 2017-05-01 DIAGNOSIS — E1140 Type 2 diabetes mellitus with diabetic neuropathy, unspecified: Secondary | ICD-10-CM | POA: Diagnosis not present

## 2017-05-01 DIAGNOSIS — G4730 Sleep apnea, unspecified: Secondary | ICD-10-CM | POA: Diagnosis not present

## 2017-05-01 DIAGNOSIS — I10 Essential (primary) hypertension: Secondary | ICD-10-CM | POA: Diagnosis not present

## 2017-06-09 DIAGNOSIS — G894 Chronic pain syndrome: Secondary | ICD-10-CM | POA: Diagnosis not present

## 2017-07-20 ENCOUNTER — Other Ambulatory Visit: Payer: Self-pay | Admitting: Cardiovascular Disease

## 2017-07-31 DIAGNOSIS — E1140 Type 2 diabetes mellitus with diabetic neuropathy, unspecified: Secondary | ICD-10-CM | POA: Diagnosis not present

## 2017-07-31 DIAGNOSIS — I10 Essential (primary) hypertension: Secondary | ICD-10-CM | POA: Diagnosis not present

## 2017-08-05 DIAGNOSIS — E1165 Type 2 diabetes mellitus with hyperglycemia: Secondary | ICD-10-CM | POA: Diagnosis not present

## 2017-08-05 DIAGNOSIS — G4730 Sleep apnea, unspecified: Secondary | ICD-10-CM | POA: Diagnosis not present

## 2017-08-05 DIAGNOSIS — I10 Essential (primary) hypertension: Secondary | ICD-10-CM | POA: Diagnosis not present

## 2017-08-05 DIAGNOSIS — J449 Chronic obstructive pulmonary disease, unspecified: Secondary | ICD-10-CM | POA: Diagnosis not present

## 2017-08-05 DIAGNOSIS — G9009 Other idiopathic peripheral autonomic neuropathy: Secondary | ICD-10-CM | POA: Diagnosis not present

## 2017-11-24 IMAGING — MR MR THORACIC SPINE W/O CM
4 of 6 series · 10 of 48 positions shown · non-contrast
Comparison: None.

CLINICAL DATA: Patient relates upper back pain with some low back
pain and right leg weakness following a possible injury working on a
combine. Plain films done earlier with prior MRI of lumbar in PACS.
Prior back surgery.

EXAM:
MRI THORACIC SPINE WITHOUT CONTRAST
TECHNIQUE: Multiplanar, multisequence MR imaging of the thoracic spine was
performed. No intravenous contrast was administered.

[Series 2: T1 · sagittal · 4.0mm · 0.57mm/px · 3 of 13 slices shown (1 of 2)]
[im 1/13]
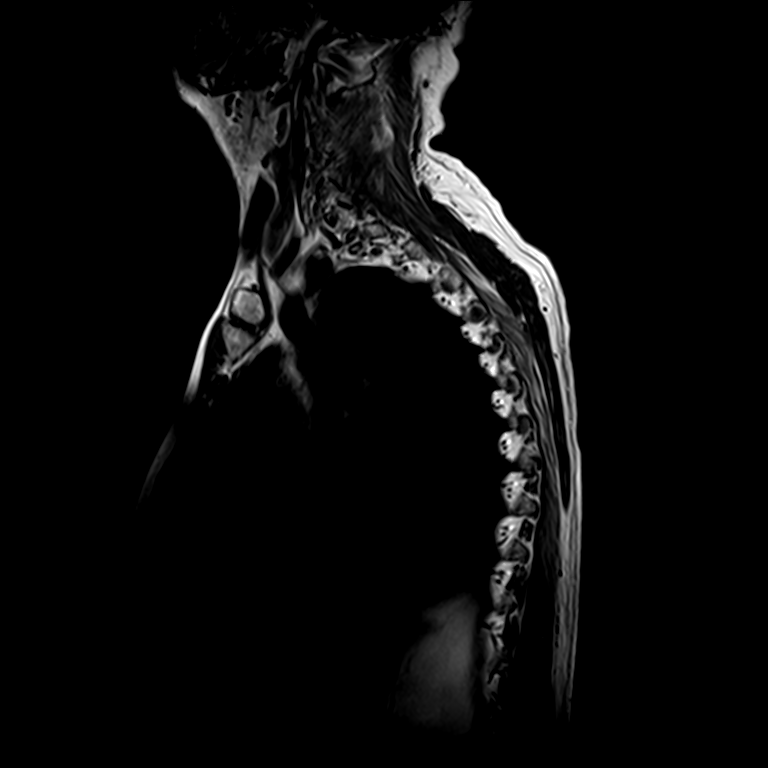
[im 7/13]
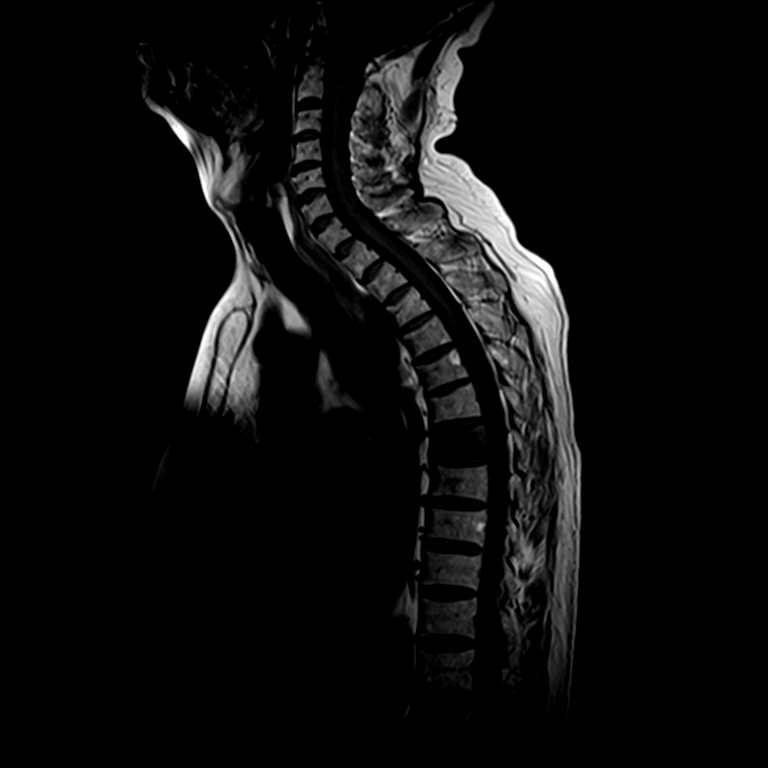
[im 13/13]
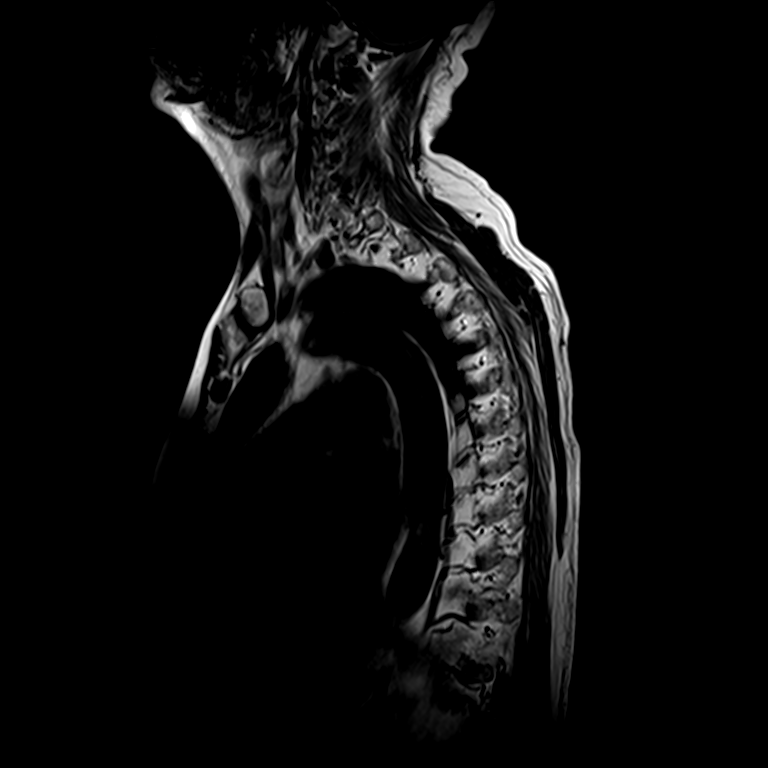

[Series 3: T2 · sagittal · 4.0mm · 0.42mm/px · 3 of 13 slices shown (1 of 2)]
[im 1/13]
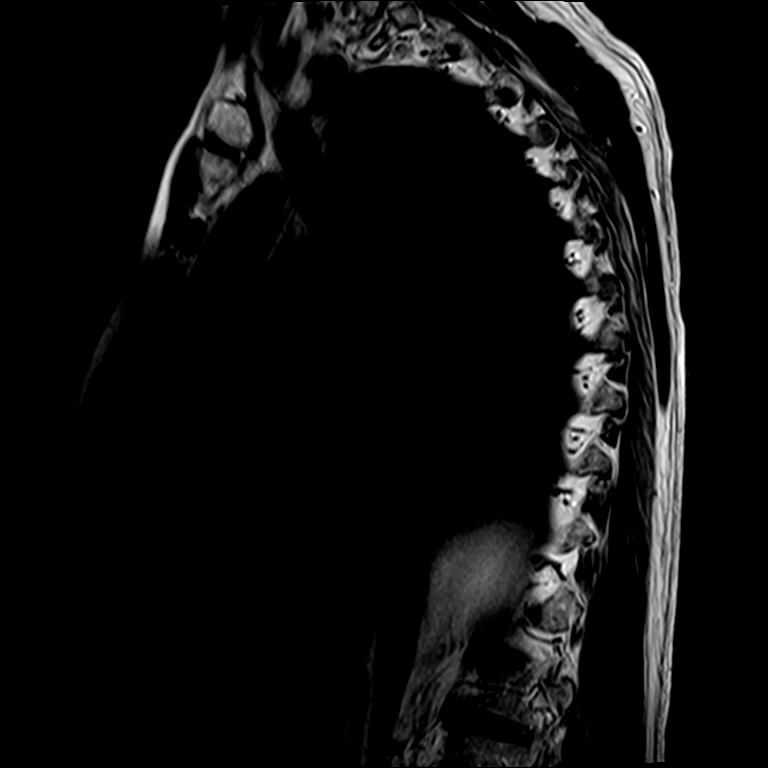
[im 7/13]
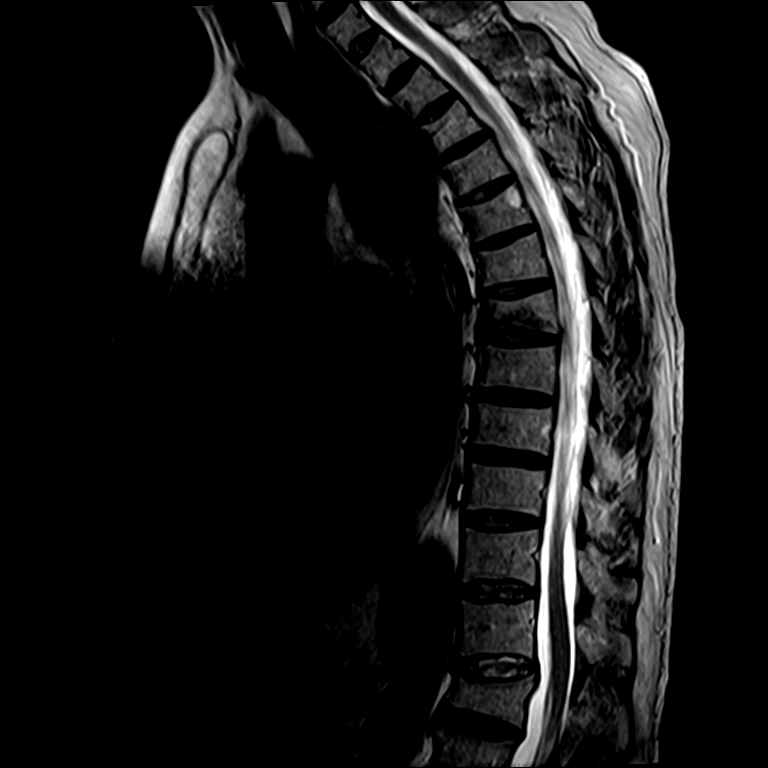
[im 13/13]
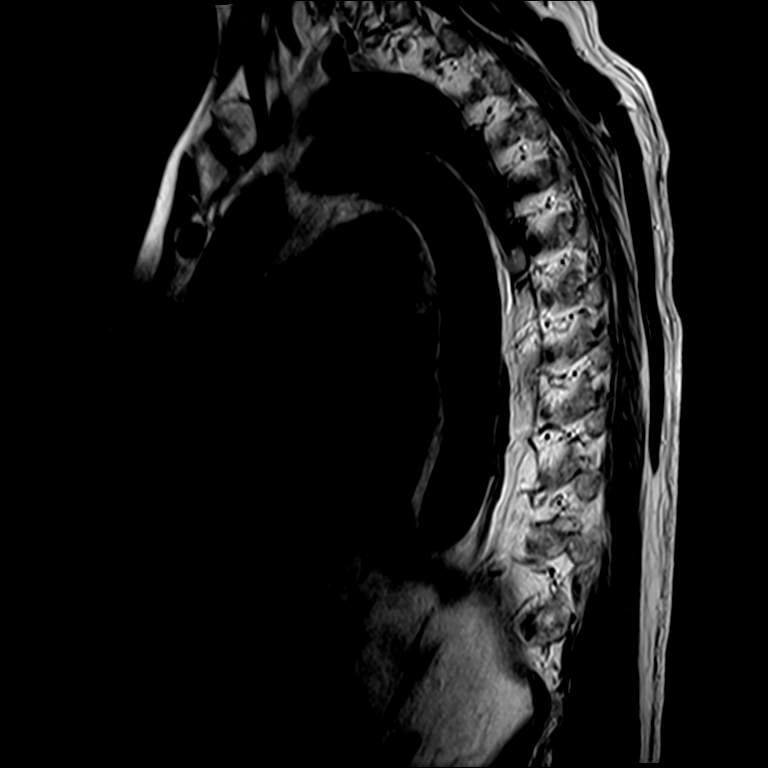

[Series 4: T1 · sagittal · 4.0mm · 0.38mm/px · 1 of 13 slices shown (2 of 2)]
[im 1/13]
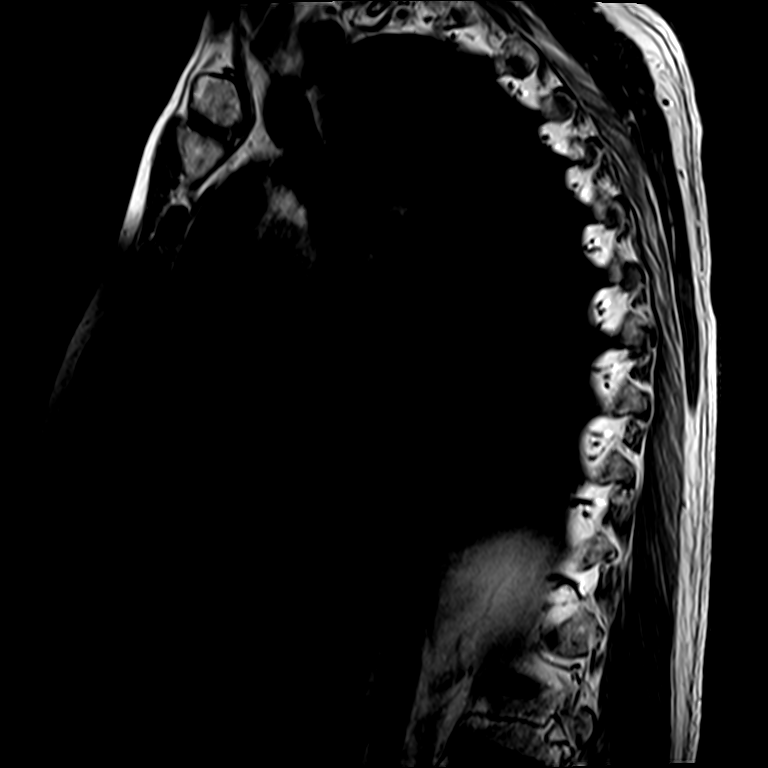

[Series 7: T2 · axial · 3.0mm · 0.24mm/px · z∈[-212,-78]mm · 3 of 39 slices shown (2 of 2)]
[im 6/39]
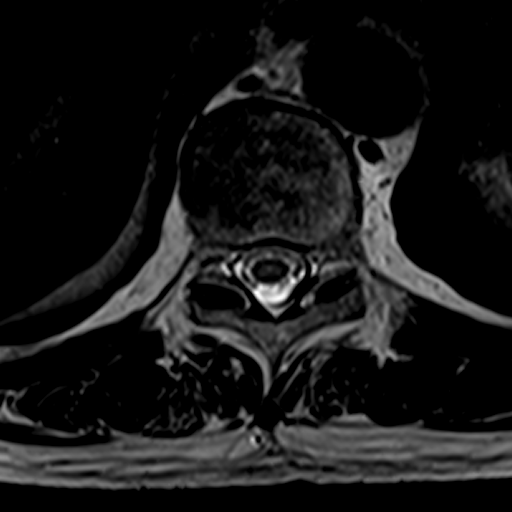
[im 21/39]
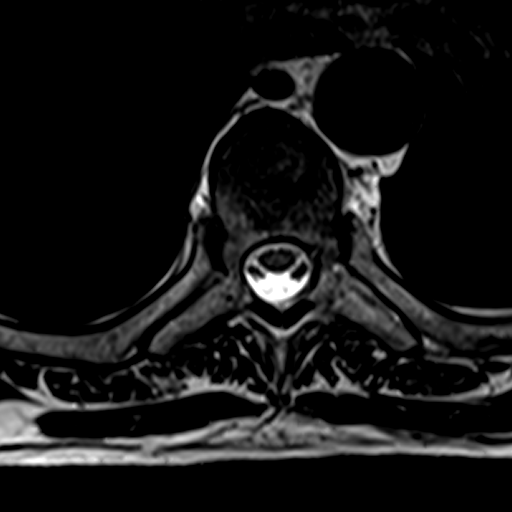
[im 33/39]
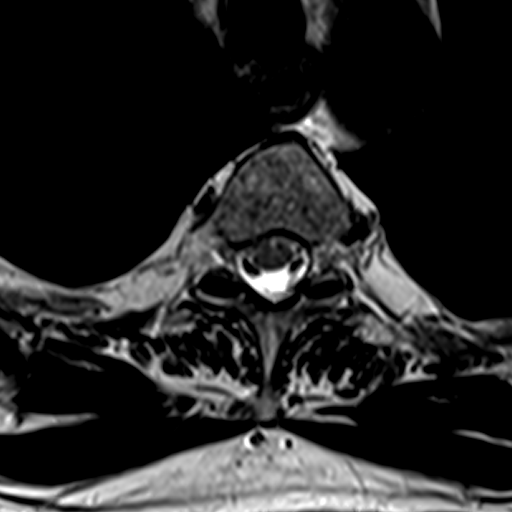

[10 of 48 positions shown; findings below may reference images not displayed]

FINDINGS: T7 vertebral body height loss with marrow edema throughout the
vertebral body with a linear component paralleling the inferior
endplate consistent with a acute compression fracture. There is no
retropulsion. There is approximately 15% height loss.

There is a chronic L1 vertebral body compression fracture without
marrow edema.

The remainder the vertebral body heights are maintained. The
alignment is anatomic. There is no acute fracture or static
listhesis. The marrow signal is otherwise normal. The thoracic
spinal cord is normal in size and signal. The disc spaces are
maintained.

T1-T2: No significant disc protrusion, foraminal stenosis or central
canal stenosis.

T2-T3: No significant disc protrusion, foraminal stenosis or central
canal stenosis.

T3-T4: No significant disc protrusion, foraminal stenosis or central
canal stenosis.

T4-T5: No significant disc protrusion, foraminal stenosis or central
canal stenosis.

T5-T6: No significant disc protrusion, foraminal stenosis or central
canal stenosis.

T6-T7: No significant disc protrusion, foraminal stenosis or central
canal stenosis.

T7-T8: No significant disc protrusion, foraminal stenosis or central
canal stenosis.

T8-T9: No significant disc protrusion, foraminal stenosis or central
canal stenosis.

T9-T10: No significant disc protrusion, foraminal stenosis or
central canal stenosis.

T10-T11: No significant disc protrusion, foraminal stenosis or
central canal stenosis.

T11-T12: No significant disc protrusion, foraminal stenosis or
central canal stenosis.
IMPRESSION: 1. Acute T7 vertebral body compression fracture with approximately
15% height loss.

## 2017-11-24 IMAGING — MR MR LUMBAR SPINE W/O CM
4 of 5 series · 15 of 48 positions shown · non-contrast
Comparison: Lumbar radiographs 01/19/2015 and earlier. CT Abdomen
and Pelvis 08/14/2006.

CLINICAL DATA: 64-year-old male with lumbar back pain and right
lower extremity weakness following injury working on a combine.
Initial encounter.

EXAM:
MRI LUMBAR SPINE WITHOUT CONTRAST
TECHNIQUE: Multiplanar, multisequence MR imaging of the lumbar spine was
performed. No intravenous contrast was administered.

[Series 5: T2 · sagittal · 4.0mm · 0.78mm/px · 6 of 17 slices shown (1 of 2)]
[im 1/17]
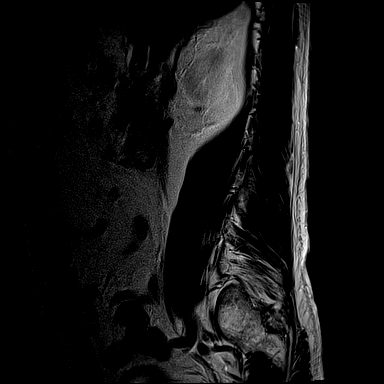
[im 4/17]
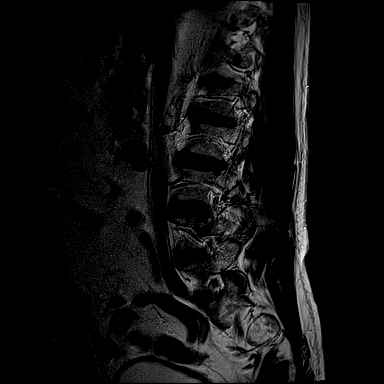
[im 7/17]
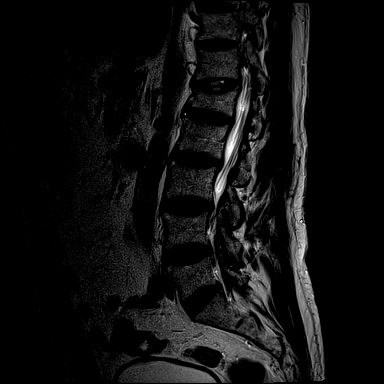
[im 10/17]
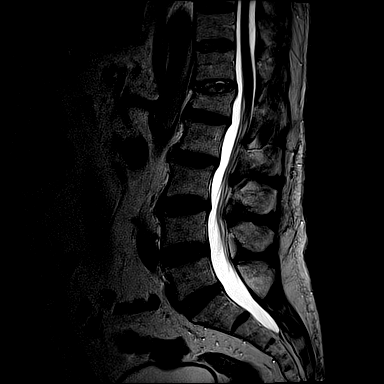
[im 13/17]
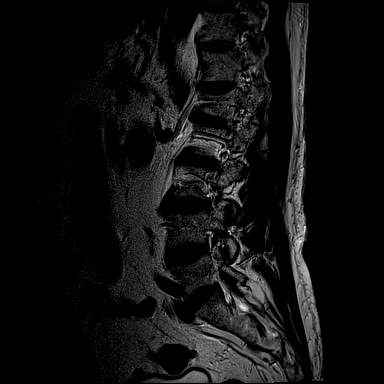
[im 17/17]
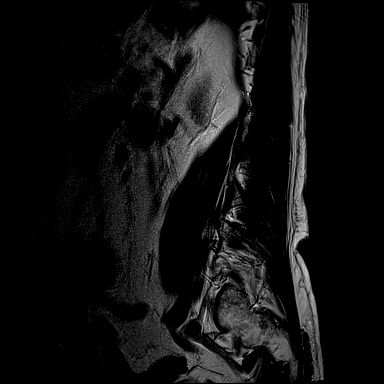

[Series 6: T1 · sagittal · 4.0mm · 0.43mm/px · 3 of 17 slices shown (1 of 2)]
[im 4/17]
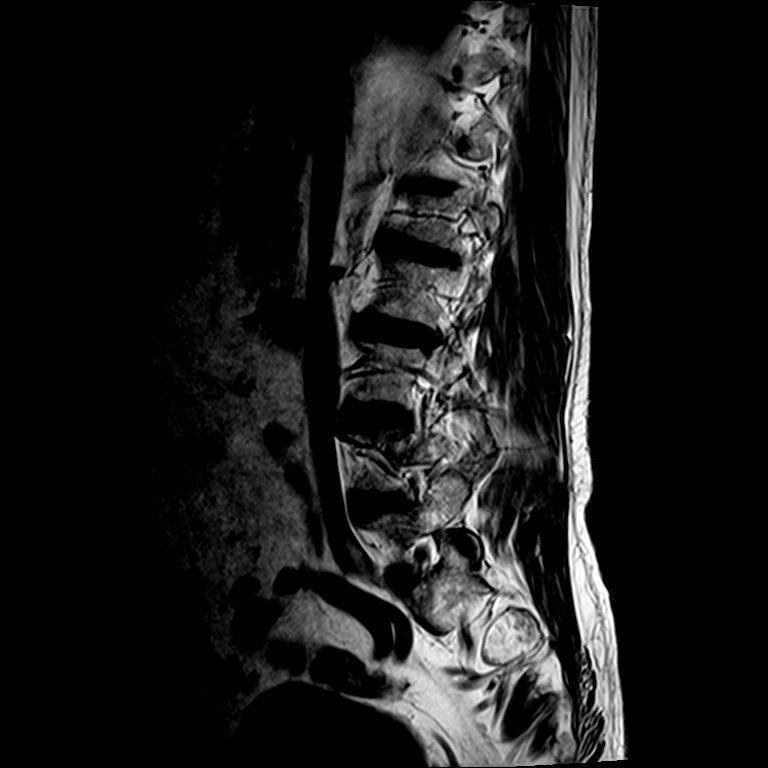
[im 10/17]
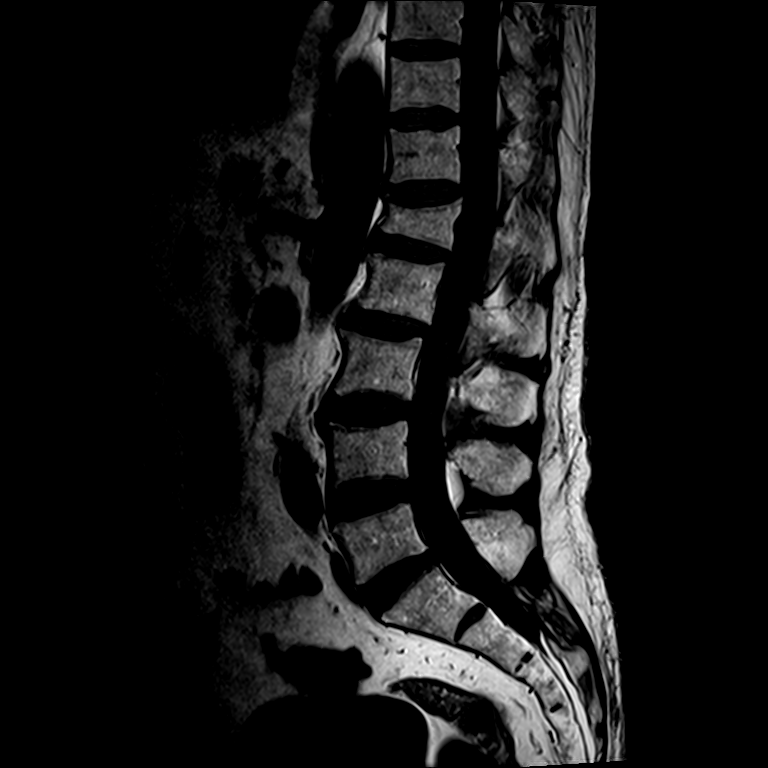
[im 17/17]
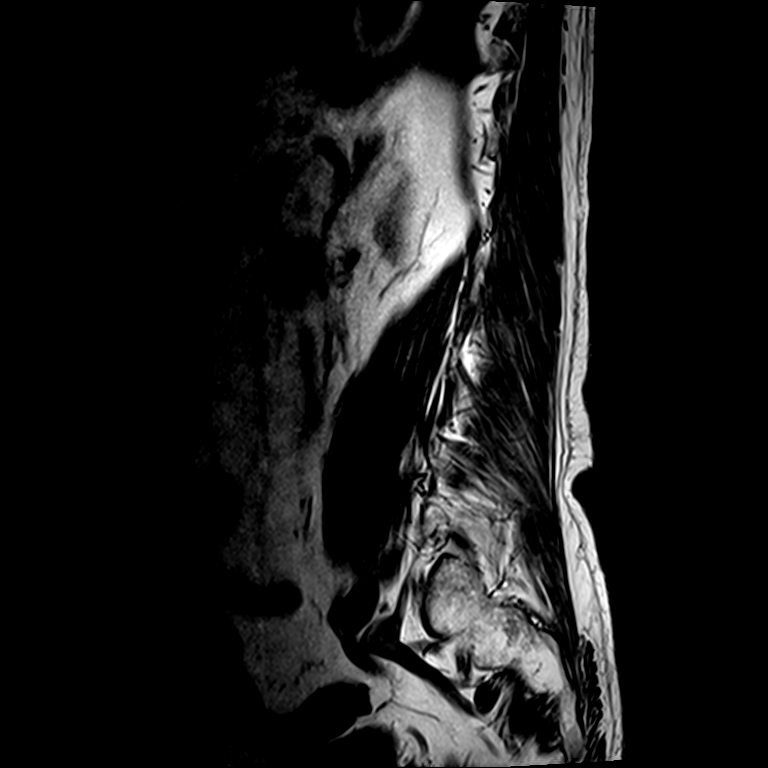

[Series 8: T2 · axial · 4.0mm · 0.22mm/px · z∈[-442,-289]mm · 3 of 46 slices shown (2 of 2)]
[im 7/46]
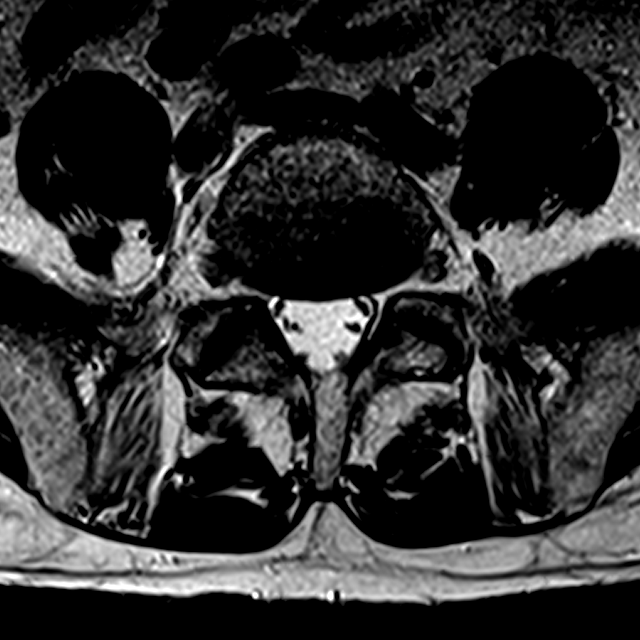
[im 23/46]
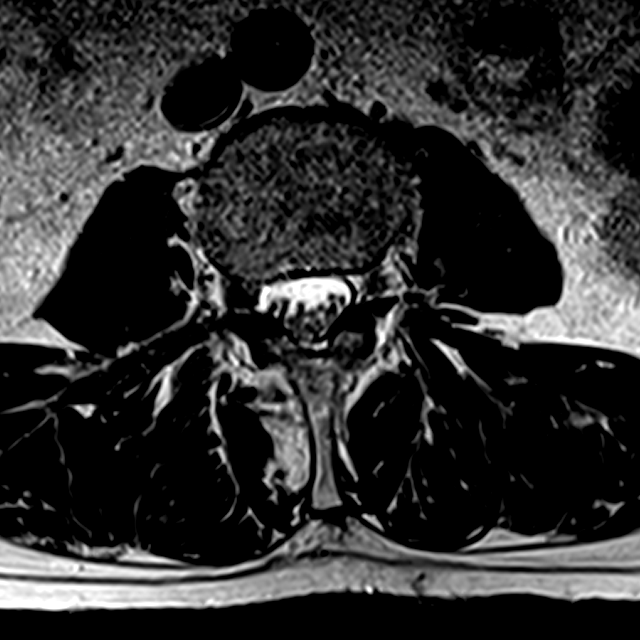
[im 39/46]
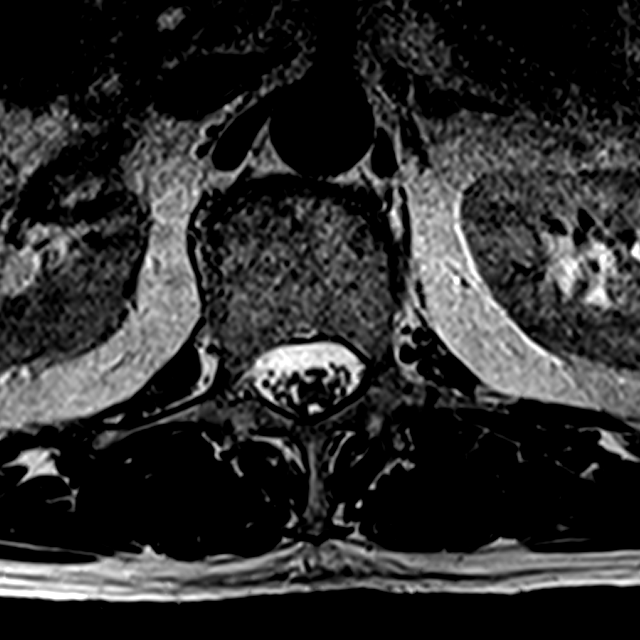

[Series 9: T1 · axial · 4.0mm · 0.23mm/px · z∈[-443,-289]mm · 3 of 46 slices shown (2 of 2)]
[im 7/46]
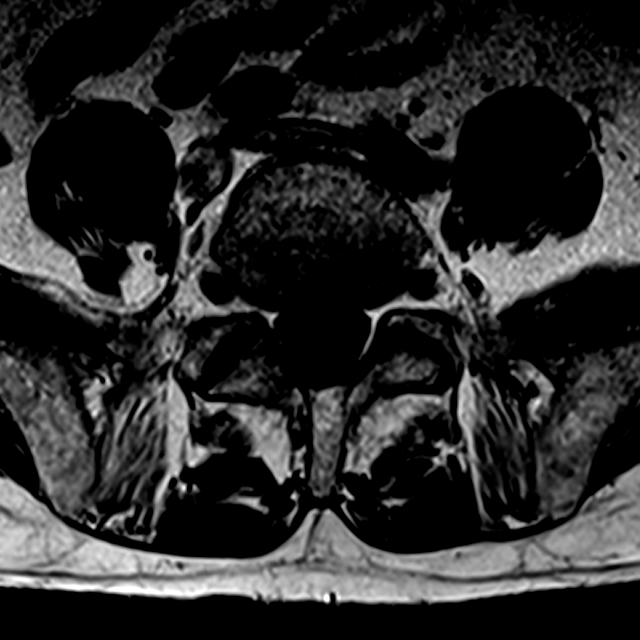
[im 23/46]
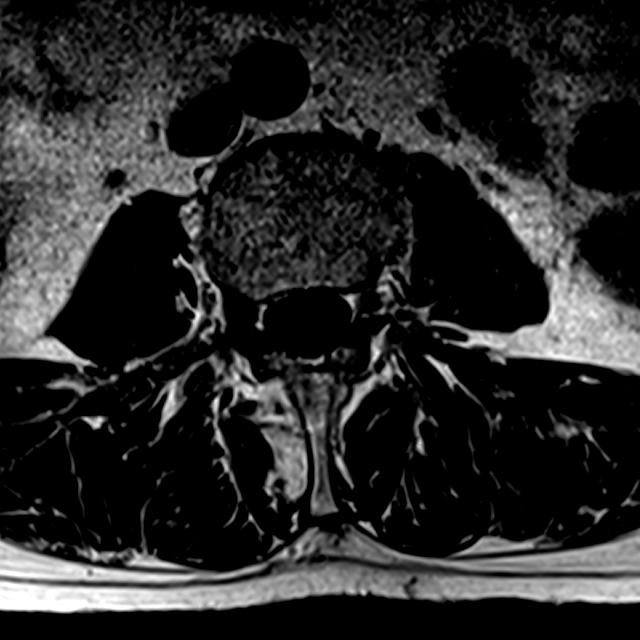
[im 39/46]
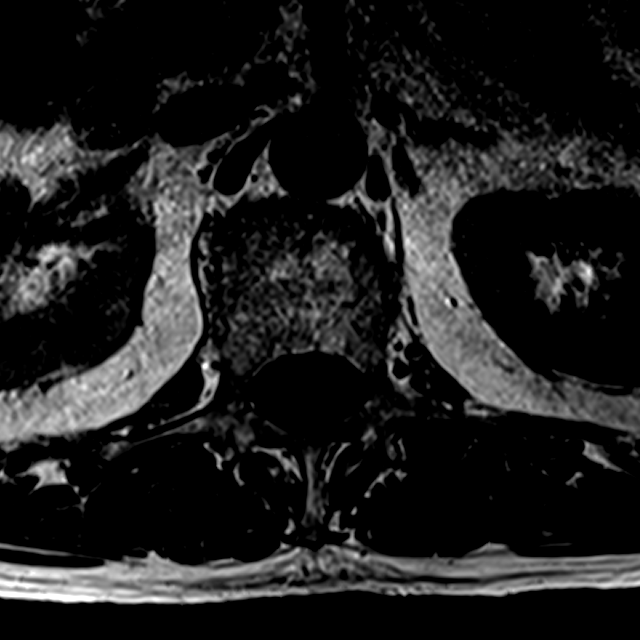

[15 of 48 positions shown; findings below may reference images not displayed]

FINDINGS: Normal lumbar segmentation. Chronic L1 compression fracture. Stable
vertebral height and alignment since 7221. No marrow edema or
evidence of acute osseous abnormality.

Visualized lower thoracic spinal cord is normal with conus medularis
at L1.

Visualized abdominal viscera and paraspinal soft tissues are within
normal limits.

T10-T11:  Mild ligament flavum hypertrophy.

T11-T12:  Mild facet hypertrophy.  Mild T11 foraminal stenosis.

T12-L1: Negative aside from minimal chronic retropulsion of the
posterior superior L1 endplate, no significant stenosis.

L1-L2:  Mild disc desiccation and circumferential disc bulge.

L2-L3: Mild disc desiccation and circumferential disc bulge.
Broad-based right foraminal component with annular fissure (series
5, image 5) in proximity to the exiting right L2 nerve. No other
stenosis.

L3-L4: Mild circumferential disc bulge. Mild to moderate facet and
ligament flavum hypertrophy. Borderline to mild spinal stenosis.
Mild to moderate left L3 foraminal stenosis (series 8, image 26).

L4-L5: Minimal disc bulge. Mild facet hypertrophy greater on the
right. Mild bilateral L4 foraminal stenosis.

L5-S1: Mild posterior disc bulge and facet hypertrophy. No stenosis.
IMPRESSION: 1. Symptomatic level [DATE]-L3 where a small foraminal disc
herniation with annular fissure could affect the exiting right L2
nerve.
2. Borderline to mild multifactorial spinal stenosis at L3-L4.
3. Other mild to moderate neural foraminal stenosis at the left L3
and bilateral L4 nerve levels.
4. Chronic L1 compression fracture, stable since [DATE].

## 2017-12-02 DIAGNOSIS — J449 Chronic obstructive pulmonary disease, unspecified: Secondary | ICD-10-CM | POA: Diagnosis not present

## 2017-12-02 DIAGNOSIS — E1140 Type 2 diabetes mellitus with diabetic neuropathy, unspecified: Secondary | ICD-10-CM | POA: Diagnosis not present

## 2017-12-02 DIAGNOSIS — E1165 Type 2 diabetes mellitus with hyperglycemia: Secondary | ICD-10-CM | POA: Diagnosis not present

## 2017-12-02 DIAGNOSIS — I10 Essential (primary) hypertension: Secondary | ICD-10-CM | POA: Diagnosis not present

## 2017-12-03 DIAGNOSIS — E1140 Type 2 diabetes mellitus with diabetic neuropathy, unspecified: Secondary | ICD-10-CM | POA: Diagnosis not present

## 2017-12-03 DIAGNOSIS — I10 Essential (primary) hypertension: Secondary | ICD-10-CM | POA: Diagnosis not present

## 2017-12-03 DIAGNOSIS — E1165 Type 2 diabetes mellitus with hyperglycemia: Secondary | ICD-10-CM | POA: Diagnosis not present

## 2017-12-07 DIAGNOSIS — G9009 Other idiopathic peripheral autonomic neuropathy: Secondary | ICD-10-CM | POA: Diagnosis not present

## 2017-12-07 DIAGNOSIS — G4730 Sleep apnea, unspecified: Secondary | ICD-10-CM | POA: Diagnosis not present

## 2017-12-07 DIAGNOSIS — Z0000 Encounter for general adult medical examination without abnormal findings: Secondary | ICD-10-CM | POA: Diagnosis not present

## 2017-12-07 DIAGNOSIS — E1140 Type 2 diabetes mellitus with diabetic neuropathy, unspecified: Secondary | ICD-10-CM | POA: Diagnosis not present

## 2017-12-07 DIAGNOSIS — I10 Essential (primary) hypertension: Secondary | ICD-10-CM | POA: Diagnosis not present

## 2017-12-21 DIAGNOSIS — I10 Essential (primary) hypertension: Secondary | ICD-10-CM | POA: Diagnosis not present

## 2017-12-21 DIAGNOSIS — E1140 Type 2 diabetes mellitus with diabetic neuropathy, unspecified: Secondary | ICD-10-CM | POA: Diagnosis not present

## 2018-01-06 DIAGNOSIS — M25512 Pain in left shoulder: Secondary | ICD-10-CM | POA: Diagnosis not present

## 2018-01-19 DIAGNOSIS — J449 Chronic obstructive pulmonary disease, unspecified: Secondary | ICD-10-CM | POA: Diagnosis not present

## 2018-01-19 DIAGNOSIS — E1165 Type 2 diabetes mellitus with hyperglycemia: Secondary | ICD-10-CM | POA: Diagnosis not present

## 2018-01-19 DIAGNOSIS — I10 Essential (primary) hypertension: Secondary | ICD-10-CM | POA: Diagnosis not present

## 2018-02-11 DIAGNOSIS — E1165 Type 2 diabetes mellitus with hyperglycemia: Secondary | ICD-10-CM | POA: Diagnosis not present

## 2018-02-11 DIAGNOSIS — I10 Essential (primary) hypertension: Secondary | ICD-10-CM | POA: Diagnosis not present

## 2018-02-11 DIAGNOSIS — E1140 Type 2 diabetes mellitus with diabetic neuropathy, unspecified: Secondary | ICD-10-CM | POA: Diagnosis not present

## 2018-02-11 DIAGNOSIS — J449 Chronic obstructive pulmonary disease, unspecified: Secondary | ICD-10-CM | POA: Diagnosis not present

## 2018-03-26 DIAGNOSIS — E1140 Type 2 diabetes mellitus with diabetic neuropathy, unspecified: Secondary | ICD-10-CM | POA: Diagnosis not present

## 2018-03-26 DIAGNOSIS — I10 Essential (primary) hypertension: Secondary | ICD-10-CM | POA: Diagnosis not present

## 2018-03-26 DIAGNOSIS — E1165 Type 2 diabetes mellitus with hyperglycemia: Secondary | ICD-10-CM | POA: Diagnosis not present

## 2018-05-24 DIAGNOSIS — E1140 Type 2 diabetes mellitus with diabetic neuropathy, unspecified: Secondary | ICD-10-CM | POA: Diagnosis not present

## 2018-05-24 DIAGNOSIS — J449 Chronic obstructive pulmonary disease, unspecified: Secondary | ICD-10-CM | POA: Diagnosis not present

## 2018-05-24 DIAGNOSIS — I10 Essential (primary) hypertension: Secondary | ICD-10-CM | POA: Diagnosis not present

## 2018-05-24 DIAGNOSIS — E1165 Type 2 diabetes mellitus with hyperglycemia: Secondary | ICD-10-CM | POA: Diagnosis not present

## 2018-06-02 DIAGNOSIS — E1140 Type 2 diabetes mellitus with diabetic neuropathy, unspecified: Secondary | ICD-10-CM | POA: Diagnosis not present

## 2018-06-02 DIAGNOSIS — I10 Essential (primary) hypertension: Secondary | ICD-10-CM | POA: Diagnosis not present

## 2018-06-02 DIAGNOSIS — E1165 Type 2 diabetes mellitus with hyperglycemia: Secondary | ICD-10-CM | POA: Diagnosis not present

## 2018-06-07 DIAGNOSIS — G9009 Other idiopathic peripheral autonomic neuropathy: Secondary | ICD-10-CM | POA: Diagnosis not present

## 2018-06-07 DIAGNOSIS — E1140 Type 2 diabetes mellitus with diabetic neuropathy, unspecified: Secondary | ICD-10-CM | POA: Diagnosis not present

## 2018-06-07 DIAGNOSIS — I10 Essential (primary) hypertension: Secondary | ICD-10-CM | POA: Diagnosis not present

## 2018-06-07 DIAGNOSIS — J449 Chronic obstructive pulmonary disease, unspecified: Secondary | ICD-10-CM | POA: Diagnosis not present

## 2018-06-07 DIAGNOSIS — E782 Mixed hyperlipidemia: Secondary | ICD-10-CM | POA: Diagnosis not present

## 2018-06-11 DIAGNOSIS — J449 Chronic obstructive pulmonary disease, unspecified: Secondary | ICD-10-CM | POA: Diagnosis not present

## 2018-06-11 DIAGNOSIS — E1140 Type 2 diabetes mellitus with diabetic neuropathy, unspecified: Secondary | ICD-10-CM | POA: Diagnosis not present

## 2018-06-11 DIAGNOSIS — E782 Mixed hyperlipidemia: Secondary | ICD-10-CM | POA: Diagnosis not present

## 2018-06-11 DIAGNOSIS — I10 Essential (primary) hypertension: Secondary | ICD-10-CM | POA: Diagnosis not present

## 2018-06-18 DIAGNOSIS — Z0000 Encounter for general adult medical examination without abnormal findings: Secondary | ICD-10-CM | POA: Diagnosis not present

## 2018-07-12 DIAGNOSIS — E1140 Type 2 diabetes mellitus with diabetic neuropathy, unspecified: Secondary | ICD-10-CM | POA: Diagnosis not present

## 2018-07-12 DIAGNOSIS — J449 Chronic obstructive pulmonary disease, unspecified: Secondary | ICD-10-CM | POA: Diagnosis not present

## 2018-07-12 DIAGNOSIS — E1165 Type 2 diabetes mellitus with hyperglycemia: Secondary | ICD-10-CM | POA: Diagnosis not present

## 2018-07-12 DIAGNOSIS — I10 Essential (primary) hypertension: Secondary | ICD-10-CM | POA: Diagnosis not present

## 2018-09-24 DIAGNOSIS — E1140 Type 2 diabetes mellitus with diabetic neuropathy, unspecified: Secondary | ICD-10-CM | POA: Diagnosis not present

## 2018-09-24 DIAGNOSIS — E782 Mixed hyperlipidemia: Secondary | ICD-10-CM | POA: Diagnosis not present

## 2018-09-24 DIAGNOSIS — I10 Essential (primary) hypertension: Secondary | ICD-10-CM | POA: Diagnosis not present

## 2018-09-24 DIAGNOSIS — E1165 Type 2 diabetes mellitus with hyperglycemia: Secondary | ICD-10-CM | POA: Diagnosis not present

## 2018-09-27 DIAGNOSIS — E782 Mixed hyperlipidemia: Secondary | ICD-10-CM | POA: Diagnosis not present

## 2018-09-27 DIAGNOSIS — I10 Essential (primary) hypertension: Secondary | ICD-10-CM | POA: Diagnosis not present

## 2018-09-27 DIAGNOSIS — J449 Chronic obstructive pulmonary disease, unspecified: Secondary | ICD-10-CM | POA: Diagnosis not present

## 2018-09-27 DIAGNOSIS — E1140 Type 2 diabetes mellitus with diabetic neuropathy, unspecified: Secondary | ICD-10-CM | POA: Diagnosis not present

## 2018-09-27 DIAGNOSIS — G9009 Other idiopathic peripheral autonomic neuropathy: Secondary | ICD-10-CM | POA: Diagnosis not present

## 2018-12-03 DIAGNOSIS — I10 Essential (primary) hypertension: Secondary | ICD-10-CM | POA: Diagnosis not present

## 2018-12-03 DIAGNOSIS — E782 Mixed hyperlipidemia: Secondary | ICD-10-CM | POA: Diagnosis not present

## 2018-12-03 DIAGNOSIS — G9009 Other idiopathic peripheral autonomic neuropathy: Secondary | ICD-10-CM | POA: Diagnosis not present

## 2018-12-03 DIAGNOSIS — E1140 Type 2 diabetes mellitus with diabetic neuropathy, unspecified: Secondary | ICD-10-CM | POA: Diagnosis not present

## 2018-12-03 DIAGNOSIS — J449 Chronic obstructive pulmonary disease, unspecified: Secondary | ICD-10-CM | POA: Diagnosis not present

## 2019-01-04 DIAGNOSIS — E1165 Type 2 diabetes mellitus with hyperglycemia: Secondary | ICD-10-CM | POA: Diagnosis not present

## 2019-01-04 DIAGNOSIS — E1140 Type 2 diabetes mellitus with diabetic neuropathy, unspecified: Secondary | ICD-10-CM | POA: Diagnosis not present

## 2019-01-04 DIAGNOSIS — E782 Mixed hyperlipidemia: Secondary | ICD-10-CM | POA: Diagnosis not present

## 2019-01-04 DIAGNOSIS — I10 Essential (primary) hypertension: Secondary | ICD-10-CM | POA: Diagnosis not present

## 2019-01-10 DIAGNOSIS — G894 Chronic pain syndrome: Secondary | ICD-10-CM | POA: Diagnosis not present

## 2019-01-10 DIAGNOSIS — I10 Essential (primary) hypertension: Secondary | ICD-10-CM | POA: Diagnosis not present

## 2019-01-10 DIAGNOSIS — Z0001 Encounter for general adult medical examination with abnormal findings: Secondary | ICD-10-CM | POA: Diagnosis not present

## 2019-01-10 DIAGNOSIS — E1140 Type 2 diabetes mellitus with diabetic neuropathy, unspecified: Secondary | ICD-10-CM | POA: Diagnosis not present

## 2019-01-10 DIAGNOSIS — G4730 Sleep apnea, unspecified: Secondary | ICD-10-CM | POA: Diagnosis not present

## 2019-03-07 DIAGNOSIS — E1165 Type 2 diabetes mellitus with hyperglycemia: Secondary | ICD-10-CM | POA: Diagnosis not present

## 2019-03-07 DIAGNOSIS — J449 Chronic obstructive pulmonary disease, unspecified: Secondary | ICD-10-CM | POA: Diagnosis not present

## 2019-03-07 DIAGNOSIS — I10 Essential (primary) hypertension: Secondary | ICD-10-CM | POA: Diagnosis not present

## 2019-03-07 DIAGNOSIS — E1140 Type 2 diabetes mellitus with diabetic neuropathy, unspecified: Secondary | ICD-10-CM | POA: Diagnosis not present

## 2019-04-26 DIAGNOSIS — E1165 Type 2 diabetes mellitus with hyperglycemia: Secondary | ICD-10-CM | POA: Diagnosis not present

## 2019-04-26 DIAGNOSIS — E1140 Type 2 diabetes mellitus with diabetic neuropathy, unspecified: Secondary | ICD-10-CM | POA: Diagnosis not present

## 2019-04-26 DIAGNOSIS — I10 Essential (primary) hypertension: Secondary | ICD-10-CM | POA: Diagnosis not present

## 2019-04-26 DIAGNOSIS — J449 Chronic obstructive pulmonary disease, unspecified: Secondary | ICD-10-CM | POA: Diagnosis not present

## 2019-05-19 DIAGNOSIS — E782 Mixed hyperlipidemia: Secondary | ICD-10-CM | POA: Diagnosis not present

## 2019-05-19 DIAGNOSIS — E1140 Type 2 diabetes mellitus with diabetic neuropathy, unspecified: Secondary | ICD-10-CM | POA: Diagnosis not present

## 2019-05-19 DIAGNOSIS — E1165 Type 2 diabetes mellitus with hyperglycemia: Secondary | ICD-10-CM | POA: Diagnosis not present

## 2019-05-23 DIAGNOSIS — E1165 Type 2 diabetes mellitus with hyperglycemia: Secondary | ICD-10-CM | POA: Diagnosis not present

## 2019-05-23 DIAGNOSIS — I10 Essential (primary) hypertension: Secondary | ICD-10-CM | POA: Diagnosis not present

## 2019-05-23 DIAGNOSIS — Z0001 Encounter for general adult medical examination with abnormal findings: Secondary | ICD-10-CM | POA: Diagnosis not present

## 2019-05-23 DIAGNOSIS — E1140 Type 2 diabetes mellitus with diabetic neuropathy, unspecified: Secondary | ICD-10-CM | POA: Diagnosis not present

## 2019-05-23 DIAGNOSIS — G9009 Other idiopathic peripheral autonomic neuropathy: Secondary | ICD-10-CM | POA: Diagnosis not present

## 2019-05-23 DIAGNOSIS — G894 Chronic pain syndrome: Secondary | ICD-10-CM | POA: Diagnosis not present

## 2019-05-23 DIAGNOSIS — J449 Chronic obstructive pulmonary disease, unspecified: Secondary | ICD-10-CM | POA: Diagnosis not present

## 2019-05-23 DIAGNOSIS — G4730 Sleep apnea, unspecified: Secondary | ICD-10-CM | POA: Diagnosis not present

## 2019-06-13 DIAGNOSIS — E1140 Type 2 diabetes mellitus with diabetic neuropathy, unspecified: Secondary | ICD-10-CM | POA: Diagnosis not present

## 2019-06-13 DIAGNOSIS — E782 Mixed hyperlipidemia: Secondary | ICD-10-CM | POA: Diagnosis not present

## 2019-06-13 DIAGNOSIS — R944 Abnormal results of kidney function studies: Secondary | ICD-10-CM | POA: Diagnosis not present

## 2019-06-13 DIAGNOSIS — I10 Essential (primary) hypertension: Secondary | ICD-10-CM | POA: Diagnosis not present

## 2019-06-13 DIAGNOSIS — E1165 Type 2 diabetes mellitus with hyperglycemia: Secondary | ICD-10-CM | POA: Diagnosis not present

## 2019-06-16 DIAGNOSIS — I10 Essential (primary) hypertension: Secondary | ICD-10-CM | POA: Diagnosis not present

## 2019-06-16 DIAGNOSIS — R944 Abnormal results of kidney function studies: Secondary | ICD-10-CM | POA: Diagnosis not present

## 2019-06-16 DIAGNOSIS — E1165 Type 2 diabetes mellitus with hyperglycemia: Secondary | ICD-10-CM | POA: Diagnosis not present

## 2019-06-16 DIAGNOSIS — E785 Hyperlipidemia, unspecified: Secondary | ICD-10-CM | POA: Diagnosis not present

## 2019-07-12 DIAGNOSIS — I10 Essential (primary) hypertension: Secondary | ICD-10-CM | POA: Diagnosis not present

## 2019-07-12 DIAGNOSIS — E785 Hyperlipidemia, unspecified: Secondary | ICD-10-CM | POA: Diagnosis not present

## 2019-07-12 DIAGNOSIS — R944 Abnormal results of kidney function studies: Secondary | ICD-10-CM | POA: Diagnosis not present

## 2019-07-12 DIAGNOSIS — E1165 Type 2 diabetes mellitus with hyperglycemia: Secondary | ICD-10-CM | POA: Diagnosis not present

## 2019-09-06 DIAGNOSIS — F17210 Nicotine dependence, cigarettes, uncomplicated: Secondary | ICD-10-CM | POA: Diagnosis not present

## 2019-09-06 DIAGNOSIS — E785 Hyperlipidemia, unspecified: Secondary | ICD-10-CM | POA: Diagnosis not present

## 2019-09-06 DIAGNOSIS — M545 Low back pain: Secondary | ICD-10-CM | POA: Diagnosis not present

## 2019-09-06 DIAGNOSIS — R0782 Intercostal pain: Secondary | ICD-10-CM | POA: Diagnosis not present

## 2019-09-06 DIAGNOSIS — E1165 Type 2 diabetes mellitus with hyperglycemia: Secondary | ICD-10-CM | POA: Diagnosis not present

## 2019-09-09 DIAGNOSIS — E785 Hyperlipidemia, unspecified: Secondary | ICD-10-CM | POA: Diagnosis not present

## 2019-09-09 DIAGNOSIS — R944 Abnormal results of kidney function studies: Secondary | ICD-10-CM | POA: Diagnosis not present

## 2019-09-09 DIAGNOSIS — E1165 Type 2 diabetes mellitus with hyperglycemia: Secondary | ICD-10-CM | POA: Diagnosis not present

## 2019-09-09 DIAGNOSIS — I10 Essential (primary) hypertension: Secondary | ICD-10-CM | POA: Diagnosis not present

## 2019-09-12 DIAGNOSIS — Z720 Tobacco use: Secondary | ICD-10-CM | POA: Diagnosis not present

## 2019-09-12 DIAGNOSIS — J449 Chronic obstructive pulmonary disease, unspecified: Secondary | ICD-10-CM | POA: Diagnosis not present

## 2019-09-12 DIAGNOSIS — I10 Essential (primary) hypertension: Secondary | ICD-10-CM | POA: Diagnosis not present

## 2019-09-12 DIAGNOSIS — E1140 Type 2 diabetes mellitus with diabetic neuropathy, unspecified: Secondary | ICD-10-CM | POA: Diagnosis not present

## 2019-09-12 DIAGNOSIS — Z0001 Encounter for general adult medical examination with abnormal findings: Secondary | ICD-10-CM | POA: Diagnosis not present

## 2019-09-14 DIAGNOSIS — I10 Essential (primary) hypertension: Secondary | ICD-10-CM | POA: Diagnosis not present

## 2019-09-14 DIAGNOSIS — D519 Vitamin B12 deficiency anemia, unspecified: Secondary | ICD-10-CM | POA: Diagnosis not present

## 2019-09-14 DIAGNOSIS — E1140 Type 2 diabetes mellitus with diabetic neuropathy, unspecified: Secondary | ICD-10-CM | POA: Diagnosis not present

## 2019-09-14 DIAGNOSIS — R0782 Intercostal pain: Secondary | ICD-10-CM | POA: Diagnosis not present

## 2019-09-14 DIAGNOSIS — F17210 Nicotine dependence, cigarettes, uncomplicated: Secondary | ICD-10-CM | POA: Diagnosis not present

## 2019-09-14 DIAGNOSIS — E785 Hyperlipidemia, unspecified: Secondary | ICD-10-CM | POA: Diagnosis not present

## 2019-09-14 DIAGNOSIS — G9009 Other idiopathic peripheral autonomic neuropathy: Secondary | ICD-10-CM | POA: Diagnosis not present

## 2019-09-14 DIAGNOSIS — E559 Vitamin D deficiency, unspecified: Secondary | ICD-10-CM | POA: Diagnosis not present

## 2019-09-14 DIAGNOSIS — G894 Chronic pain syndrome: Secondary | ICD-10-CM | POA: Diagnosis not present

## 2019-09-14 DIAGNOSIS — M545 Low back pain: Secondary | ICD-10-CM | POA: Diagnosis not present

## 2019-09-14 DIAGNOSIS — G4730 Sleep apnea, unspecified: Secondary | ICD-10-CM | POA: Diagnosis not present

## 2019-10-18 DIAGNOSIS — I10 Essential (primary) hypertension: Secondary | ICD-10-CM | POA: Diagnosis not present

## 2019-10-18 DIAGNOSIS — G894 Chronic pain syndrome: Secondary | ICD-10-CM | POA: Diagnosis not present

## 2019-10-18 DIAGNOSIS — E1140 Type 2 diabetes mellitus with diabetic neuropathy, unspecified: Secondary | ICD-10-CM | POA: Diagnosis not present

## 2019-10-18 DIAGNOSIS — Z720 Tobacco use: Secondary | ICD-10-CM | POA: Diagnosis not present

## 2019-10-18 DIAGNOSIS — J449 Chronic obstructive pulmonary disease, unspecified: Secondary | ICD-10-CM | POA: Diagnosis not present

## 2019-10-26 DIAGNOSIS — M25512 Pain in left shoulder: Secondary | ICD-10-CM | POA: Diagnosis not present

## 2019-10-26 DIAGNOSIS — M25511 Pain in right shoulder: Secondary | ICD-10-CM | POA: Diagnosis not present

## 2019-11-14 DIAGNOSIS — E782 Mixed hyperlipidemia: Secondary | ICD-10-CM | POA: Diagnosis not present

## 2019-11-14 DIAGNOSIS — J449 Chronic obstructive pulmonary disease, unspecified: Secondary | ICD-10-CM | POA: Diagnosis not present

## 2019-11-14 DIAGNOSIS — G894 Chronic pain syndrome: Secondary | ICD-10-CM | POA: Diagnosis not present

## 2019-11-14 DIAGNOSIS — E1140 Type 2 diabetes mellitus with diabetic neuropathy, unspecified: Secondary | ICD-10-CM | POA: Diagnosis not present

## 2019-11-14 DIAGNOSIS — I10 Essential (primary) hypertension: Secondary | ICD-10-CM | POA: Diagnosis not present

## 2020-01-02 DIAGNOSIS — J019 Acute sinusitis, unspecified: Secondary | ICD-10-CM | POA: Diagnosis not present

## 2020-01-17 ENCOUNTER — Ambulatory Visit (HOSPITAL_COMMUNITY)
Admission: RE | Admit: 2020-01-17 | Discharge: 2020-01-17 | Disposition: A | Discharge status: Home / Self Care | Payer: Medicare Other | Source: Ambulatory Visit | Attending: Internal Medicine | Admitting: Internal Medicine

## 2020-01-17 ENCOUNTER — Other Ambulatory Visit: Payer: Self-pay

## 2020-01-17 ENCOUNTER — Other Ambulatory Visit (HOSPITAL_COMMUNITY): Payer: Self-pay | Admitting: Internal Medicine

## 2020-01-17 DIAGNOSIS — R0789 Other chest pain: Secondary | ICD-10-CM | POA: Insufficient documentation

## 2020-01-17 DIAGNOSIS — M25512 Pain in left shoulder: Secondary | ICD-10-CM | POA: Diagnosis not present

## 2020-01-17 DIAGNOSIS — W11XXXA Fall on and from ladder, initial encounter: Secondary | ICD-10-CM

## 2020-02-10 DIAGNOSIS — I10 Essential (primary) hypertension: Secondary | ICD-10-CM | POA: Diagnosis not present

## 2020-02-10 DIAGNOSIS — G9009 Other idiopathic peripheral autonomic neuropathy: Secondary | ICD-10-CM | POA: Diagnosis not present

## 2020-02-10 DIAGNOSIS — E1140 Type 2 diabetes mellitus with diabetic neuropathy, unspecified: Secondary | ICD-10-CM | POA: Diagnosis not present

## 2020-03-10 DIAGNOSIS — E782 Mixed hyperlipidemia: Secondary | ICD-10-CM | POA: Diagnosis not present

## 2020-03-10 DIAGNOSIS — I10 Essential (primary) hypertension: Secondary | ICD-10-CM | POA: Diagnosis not present

## 2020-03-10 DIAGNOSIS — R0781 Pleurodynia: Secondary | ICD-10-CM | POA: Diagnosis not present

## 2020-04-09 DIAGNOSIS — R0781 Pleurodynia: Secondary | ICD-10-CM | POA: Diagnosis not present

## 2020-04-09 DIAGNOSIS — I10 Essential (primary) hypertension: Secondary | ICD-10-CM | POA: Diagnosis not present

## 2020-04-09 DIAGNOSIS — E1165 Type 2 diabetes mellitus with hyperglycemia: Secondary | ICD-10-CM | POA: Diagnosis not present

## 2020-05-08 DIAGNOSIS — E785 Hyperlipidemia, unspecified: Secondary | ICD-10-CM | POA: Diagnosis not present

## 2020-05-08 DIAGNOSIS — E1165 Type 2 diabetes mellitus with hyperglycemia: Secondary | ICD-10-CM | POA: Diagnosis not present

## 2020-05-08 DIAGNOSIS — R0782 Intercostal pain: Secondary | ICD-10-CM | POA: Diagnosis not present

## 2020-05-08 DIAGNOSIS — M25511 Pain in right shoulder: Secondary | ICD-10-CM | POA: Diagnosis not present

## 2020-05-08 DIAGNOSIS — E559 Vitamin D deficiency, unspecified: Secondary | ICD-10-CM | POA: Diagnosis not present

## 2020-05-08 DIAGNOSIS — F17210 Nicotine dependence, cigarettes, uncomplicated: Secondary | ICD-10-CM | POA: Diagnosis not present

## 2020-05-09 DIAGNOSIS — I10 Essential (primary) hypertension: Secondary | ICD-10-CM | POA: Diagnosis not present

## 2020-05-09 DIAGNOSIS — E785 Hyperlipidemia, unspecified: Secondary | ICD-10-CM | POA: Diagnosis not present

## 2020-05-14 DIAGNOSIS — R944 Abnormal results of kidney function studies: Secondary | ICD-10-CM | POA: Diagnosis not present

## 2020-05-14 DIAGNOSIS — E1140 Type 2 diabetes mellitus with diabetic neuropathy, unspecified: Secondary | ICD-10-CM | POA: Diagnosis not present

## 2020-05-14 DIAGNOSIS — J449 Chronic obstructive pulmonary disease, unspecified: Secondary | ICD-10-CM | POA: Diagnosis not present

## 2020-05-14 DIAGNOSIS — M25512 Pain in left shoulder: Secondary | ICD-10-CM | POA: Diagnosis not present

## 2020-05-14 DIAGNOSIS — I10 Essential (primary) hypertension: Secondary | ICD-10-CM | POA: Diagnosis not present

## 2020-05-14 DIAGNOSIS — G9009 Other idiopathic peripheral autonomic neuropathy: Secondary | ICD-10-CM | POA: Diagnosis not present

## 2020-05-14 DIAGNOSIS — M545 Low back pain, unspecified: Secondary | ICD-10-CM | POA: Diagnosis not present

## 2020-05-14 DIAGNOSIS — F17210 Nicotine dependence, cigarettes, uncomplicated: Secondary | ICD-10-CM | POA: Diagnosis not present

## 2020-05-14 DIAGNOSIS — G894 Chronic pain syndrome: Secondary | ICD-10-CM | POA: Diagnosis not present

## 2020-05-14 DIAGNOSIS — E782 Mixed hyperlipidemia: Secondary | ICD-10-CM | POA: Diagnosis not present

## 2020-05-14 DIAGNOSIS — G4730 Sleep apnea, unspecified: Secondary | ICD-10-CM | POA: Diagnosis not present

## 2020-06-10 DIAGNOSIS — E1165 Type 2 diabetes mellitus with hyperglycemia: Secondary | ICD-10-CM | POA: Diagnosis not present

## 2020-06-10 DIAGNOSIS — J309 Allergic rhinitis, unspecified: Secondary | ICD-10-CM | POA: Diagnosis not present

## 2020-08-09 DIAGNOSIS — J309 Allergic rhinitis, unspecified: Secondary | ICD-10-CM | POA: Diagnosis not present

## 2020-08-09 DIAGNOSIS — E1165 Type 2 diabetes mellitus with hyperglycemia: Secondary | ICD-10-CM | POA: Diagnosis not present

## 2020-08-10 DIAGNOSIS — J309 Allergic rhinitis, unspecified: Secondary | ICD-10-CM | POA: Diagnosis not present

## 2020-08-10 DIAGNOSIS — E1165 Type 2 diabetes mellitus with hyperglycemia: Secondary | ICD-10-CM | POA: Diagnosis not present

## 2020-08-22 DIAGNOSIS — Z0100 Encounter for examination of eyes and vision without abnormal findings: Secondary | ICD-10-CM | POA: Diagnosis not present

## 2020-08-22 DIAGNOSIS — E119 Type 2 diabetes mellitus without complications: Secondary | ICD-10-CM | POA: Diagnosis not present

## 2020-10-10 DIAGNOSIS — E1165 Type 2 diabetes mellitus with hyperglycemia: Secondary | ICD-10-CM | POA: Diagnosis not present

## 2020-10-10 DIAGNOSIS — J309 Allergic rhinitis, unspecified: Secondary | ICD-10-CM | POA: Diagnosis not present

## 2020-10-31 DIAGNOSIS — R5383 Other fatigue: Secondary | ICD-10-CM | POA: Diagnosis not present

## 2020-10-31 DIAGNOSIS — U071 COVID-19: Secondary | ICD-10-CM | POA: Diagnosis not present

## 2020-10-31 DIAGNOSIS — R059 Cough, unspecified: Secondary | ICD-10-CM | POA: Diagnosis not present

## 2020-11-09 DIAGNOSIS — J309 Allergic rhinitis, unspecified: Secondary | ICD-10-CM | POA: Diagnosis not present

## 2020-11-09 DIAGNOSIS — E1165 Type 2 diabetes mellitus with hyperglycemia: Secondary | ICD-10-CM | POA: Diagnosis not present

## 2020-11-27 DIAGNOSIS — J449 Chronic obstructive pulmonary disease, unspecified: Secondary | ICD-10-CM | POA: Diagnosis not present

## 2020-11-27 DIAGNOSIS — Z23 Encounter for immunization: Secondary | ICD-10-CM | POA: Diagnosis not present

## 2020-11-27 DIAGNOSIS — Z0001 Encounter for general adult medical examination with abnormal findings: Secondary | ICD-10-CM | POA: Diagnosis not present

## 2020-11-27 DIAGNOSIS — F17200 Nicotine dependence, unspecified, uncomplicated: Secondary | ICD-10-CM | POA: Diagnosis not present

## 2020-11-27 DIAGNOSIS — N1831 Chronic kidney disease, stage 3a: Secondary | ICD-10-CM | POA: Diagnosis not present

## 2020-11-27 DIAGNOSIS — J309 Allergic rhinitis, unspecified: Secondary | ICD-10-CM | POA: Diagnosis not present

## 2020-11-27 DIAGNOSIS — E782 Mixed hyperlipidemia: Secondary | ICD-10-CM | POA: Diagnosis not present

## 2020-11-27 DIAGNOSIS — R5383 Other fatigue: Secondary | ICD-10-CM | POA: Diagnosis not present

## 2020-11-27 DIAGNOSIS — E1140 Type 2 diabetes mellitus with diabetic neuropathy, unspecified: Secondary | ICD-10-CM | POA: Diagnosis not present

## 2020-11-27 DIAGNOSIS — M545 Low back pain, unspecified: Secondary | ICD-10-CM | POA: Diagnosis not present

## 2020-11-27 DIAGNOSIS — I10 Essential (primary) hypertension: Secondary | ICD-10-CM | POA: Diagnosis not present

## 2020-12-10 DIAGNOSIS — J309 Allergic rhinitis, unspecified: Secondary | ICD-10-CM | POA: Diagnosis not present

## 2020-12-10 DIAGNOSIS — E1165 Type 2 diabetes mellitus with hyperglycemia: Secondary | ICD-10-CM | POA: Diagnosis not present

## 2021-01-09 DIAGNOSIS — J309 Allergic rhinitis, unspecified: Secondary | ICD-10-CM | POA: Diagnosis not present

## 2021-01-09 DIAGNOSIS — E1165 Type 2 diabetes mellitus with hyperglycemia: Secondary | ICD-10-CM | POA: Diagnosis not present

## 2021-02-08 DIAGNOSIS — J309 Allergic rhinitis, unspecified: Secondary | ICD-10-CM | POA: Diagnosis not present

## 2021-02-08 DIAGNOSIS — E1165 Type 2 diabetes mellitus with hyperglycemia: Secondary | ICD-10-CM | POA: Diagnosis not present

## 2021-02-28 DIAGNOSIS — E782 Mixed hyperlipidemia: Secondary | ICD-10-CM | POA: Diagnosis not present

## 2021-02-28 DIAGNOSIS — E1140 Type 2 diabetes mellitus with diabetic neuropathy, unspecified: Secondary | ICD-10-CM | POA: Diagnosis not present

## 2021-03-02 DIAGNOSIS — U071 COVID-19: Secondary | ICD-10-CM | POA: Diagnosis not present

## 2021-03-05 DIAGNOSIS — E782 Mixed hyperlipidemia: Secondary | ICD-10-CM | POA: Diagnosis not present

## 2021-03-05 DIAGNOSIS — I10 Essential (primary) hypertension: Secondary | ICD-10-CM | POA: Diagnosis not present

## 2021-03-05 DIAGNOSIS — R5383 Other fatigue: Secondary | ICD-10-CM | POA: Diagnosis not present

## 2021-03-05 DIAGNOSIS — G4730 Sleep apnea, unspecified: Secondary | ICD-10-CM | POA: Diagnosis not present

## 2021-03-05 DIAGNOSIS — E1140 Type 2 diabetes mellitus with diabetic neuropathy, unspecified: Secondary | ICD-10-CM | POA: Diagnosis not present

## 2021-03-05 DIAGNOSIS — N1831 Chronic kidney disease, stage 3a: Secondary | ICD-10-CM | POA: Diagnosis not present

## 2021-03-05 DIAGNOSIS — J309 Allergic rhinitis, unspecified: Secondary | ICD-10-CM | POA: Diagnosis not present

## 2021-03-05 DIAGNOSIS — F17200 Nicotine dependence, unspecified, uncomplicated: Secondary | ICD-10-CM | POA: Diagnosis not present

## 2021-03-05 DIAGNOSIS — M545 Low back pain, unspecified: Secondary | ICD-10-CM | POA: Diagnosis not present

## 2021-03-05 DIAGNOSIS — J449 Chronic obstructive pulmonary disease, unspecified: Secondary | ICD-10-CM | POA: Diagnosis not present

## 2021-03-12 DIAGNOSIS — E1165 Type 2 diabetes mellitus with hyperglycemia: Secondary | ICD-10-CM | POA: Diagnosis not present

## 2021-03-12 DIAGNOSIS — J309 Allergic rhinitis, unspecified: Secondary | ICD-10-CM | POA: Diagnosis not present

## 2021-04-09 DIAGNOSIS — J309 Allergic rhinitis, unspecified: Secondary | ICD-10-CM | POA: Diagnosis not present

## 2021-04-09 DIAGNOSIS — E1165 Type 2 diabetes mellitus with hyperglycemia: Secondary | ICD-10-CM | POA: Diagnosis not present

## 2021-05-10 DIAGNOSIS — J309 Allergic rhinitis, unspecified: Secondary | ICD-10-CM | POA: Diagnosis not present

## 2021-05-10 DIAGNOSIS — E1165 Type 2 diabetes mellitus with hyperglycemia: Secondary | ICD-10-CM | POA: Diagnosis not present

## 2021-06-07 DIAGNOSIS — N1831 Chronic kidney disease, stage 3a: Secondary | ICD-10-CM | POA: Diagnosis not present

## 2021-06-07 DIAGNOSIS — E1140 Type 2 diabetes mellitus with diabetic neuropathy, unspecified: Secondary | ICD-10-CM | POA: Diagnosis not present

## 2021-06-07 DIAGNOSIS — E782 Mixed hyperlipidemia: Secondary | ICD-10-CM | POA: Diagnosis not present

## 2021-06-09 DIAGNOSIS — J449 Chronic obstructive pulmonary disease, unspecified: Secondary | ICD-10-CM | POA: Diagnosis not present

## 2021-06-09 DIAGNOSIS — I129 Hypertensive chronic kidney disease with stage 1 through stage 4 chronic kidney disease, or unspecified chronic kidney disease: Secondary | ICD-10-CM | POA: Diagnosis not present

## 2021-06-09 DIAGNOSIS — N1831 Chronic kidney disease, stage 3a: Secondary | ICD-10-CM | POA: Diagnosis not present

## 2021-06-09 DIAGNOSIS — E1140 Type 2 diabetes mellitus with diabetic neuropathy, unspecified: Secondary | ICD-10-CM | POA: Diagnosis not present

## 2021-06-12 DIAGNOSIS — I10 Essential (primary) hypertension: Secondary | ICD-10-CM | POA: Diagnosis not present

## 2021-06-12 DIAGNOSIS — E1140 Type 2 diabetes mellitus with diabetic neuropathy, unspecified: Secondary | ICD-10-CM | POA: Diagnosis not present

## 2021-06-12 DIAGNOSIS — N1831 Chronic kidney disease, stage 3a: Secondary | ICD-10-CM | POA: Diagnosis not present

## 2021-06-12 DIAGNOSIS — J309 Allergic rhinitis, unspecified: Secondary | ICD-10-CM | POA: Diagnosis not present

## 2021-06-12 DIAGNOSIS — G4730 Sleep apnea, unspecified: Secondary | ICD-10-CM | POA: Diagnosis not present

## 2021-06-12 DIAGNOSIS — F17200 Nicotine dependence, unspecified, uncomplicated: Secondary | ICD-10-CM | POA: Diagnosis not present

## 2021-06-12 DIAGNOSIS — R5383 Other fatigue: Secondary | ICD-10-CM | POA: Diagnosis not present

## 2021-06-12 DIAGNOSIS — J449 Chronic obstructive pulmonary disease, unspecified: Secondary | ICD-10-CM | POA: Diagnosis not present

## 2021-06-12 DIAGNOSIS — M545 Low back pain, unspecified: Secondary | ICD-10-CM | POA: Diagnosis not present

## 2021-06-12 DIAGNOSIS — E782 Mixed hyperlipidemia: Secondary | ICD-10-CM | POA: Diagnosis not present

## 2021-09-23 ENCOUNTER — Other Ambulatory Visit (HOSPITAL_COMMUNITY): Payer: Self-pay | Admitting: Nurse Practitioner

## 2021-09-23 DIAGNOSIS — I10 Essential (primary) hypertension: Secondary | ICD-10-CM

## 2021-10-02 ENCOUNTER — Ambulatory Visit (HOSPITAL_COMMUNITY)
Admission: RE | Admit: 2021-10-02 | Discharge: 2021-10-02 | Disposition: A | Discharge status: Home / Self Care | Payer: Medicare Other | Source: Ambulatory Visit | Attending: Nurse Practitioner | Admitting: Nurse Practitioner

## 2021-10-02 DIAGNOSIS — I10 Essential (primary) hypertension: Secondary | ICD-10-CM | POA: Diagnosis not present

## 2021-10-02 LAB — ECHOCARDIOGRAM COMPLETE
Area-P 1/2: 6.32 cm2
S' Lateral: 3.2 cm

## 2021-10-02 NOTE — Progress Notes (Signed)
*  PRELIMINARY RESULTS* Echocardiogram 2D Echocardiogram has been performed. [facility] office called and spoke with Alan Ripper re: patient's increased BP and hear rate. Alan Ripper told me they had called in a new medicine for BP to [place]. Patient informed of such.  Stacey Drain 10/02/2021, 3:01 PM

## 2021-10-30 DIAGNOSIS — I10 Essential (primary) hypertension: Secondary | ICD-10-CM | POA: Diagnosis not present

## 2021-11-04 ENCOUNTER — Ambulatory Visit (HOSPITAL_COMMUNITY)
Admission: RE | Admit: 2021-11-04 | Discharge: 2021-11-04 | Disposition: A | Discharge status: Home / Self Care | Payer: Medicare Other | Source: Ambulatory Visit | Attending: Internal Medicine | Admitting: Internal Medicine

## 2021-11-04 ENCOUNTER — Other Ambulatory Visit (HOSPITAL_COMMUNITY): Payer: Self-pay | Admitting: Internal Medicine

## 2021-11-04 DIAGNOSIS — J069 Acute upper respiratory infection, unspecified: Secondary | ICD-10-CM

## 2021-11-04 DIAGNOSIS — J9811 Atelectasis: Secondary | ICD-10-CM | POA: Diagnosis not present

## 2021-11-04 DIAGNOSIS — M545 Low back pain, unspecified: Secondary | ICD-10-CM | POA: Diagnosis not present

## 2021-11-04 DIAGNOSIS — I10 Essential (primary) hypertension: Secondary | ICD-10-CM | POA: Diagnosis not present

## 2021-12-13 DIAGNOSIS — E1140 Type 2 diabetes mellitus with diabetic neuropathy, unspecified: Secondary | ICD-10-CM | POA: Diagnosis not present

## 2021-12-13 DIAGNOSIS — E782 Mixed hyperlipidemia: Secondary | ICD-10-CM | POA: Diagnosis not present

## 2021-12-13 DIAGNOSIS — N1831 Chronic kidney disease, stage 3a: Secondary | ICD-10-CM | POA: Diagnosis not present

## 2021-12-18 DIAGNOSIS — N1831 Chronic kidney disease, stage 3a: Secondary | ICD-10-CM | POA: Diagnosis not present

## 2021-12-18 DIAGNOSIS — M545 Low back pain, unspecified: Secondary | ICD-10-CM | POA: Diagnosis not present

## 2021-12-18 DIAGNOSIS — E1140 Type 2 diabetes mellitus with diabetic neuropathy, unspecified: Secondary | ICD-10-CM | POA: Diagnosis not present

## 2021-12-18 DIAGNOSIS — R5383 Other fatigue: Secondary | ICD-10-CM | POA: Diagnosis not present

## 2021-12-18 DIAGNOSIS — E782 Mixed hyperlipidemia: Secondary | ICD-10-CM | POA: Diagnosis not present

## 2021-12-18 DIAGNOSIS — J309 Allergic rhinitis, unspecified: Secondary | ICD-10-CM | POA: Diagnosis not present

## 2021-12-18 DIAGNOSIS — I10 Essential (primary) hypertension: Secondary | ICD-10-CM | POA: Diagnosis not present

## 2021-12-18 DIAGNOSIS — F17200 Nicotine dependence, unspecified, uncomplicated: Secondary | ICD-10-CM | POA: Diagnosis not present

## 2021-12-18 DIAGNOSIS — G4730 Sleep apnea, unspecified: Secondary | ICD-10-CM | POA: Diagnosis not present

## 2021-12-18 DIAGNOSIS — J449 Chronic obstructive pulmonary disease, unspecified: Secondary | ICD-10-CM | POA: Diagnosis not present

## 2022-01-06 ENCOUNTER — Other Ambulatory Visit (HOSPITAL_COMMUNITY): Payer: Self-pay | Admitting: Family Medicine

## 2022-01-06 DIAGNOSIS — J069 Acute upper respiratory infection, unspecified: Secondary | ICD-10-CM

## 2022-04-11 DIAGNOSIS — E1140 Type 2 diabetes mellitus with diabetic neuropathy, unspecified: Secondary | ICD-10-CM | POA: Diagnosis not present

## 2022-04-11 DIAGNOSIS — E782 Mixed hyperlipidemia: Secondary | ICD-10-CM | POA: Diagnosis not present

## 2022-04-16 DIAGNOSIS — I129 Hypertensive chronic kidney disease with stage 1 through stage 4 chronic kidney disease, or unspecified chronic kidney disease: Secondary | ICD-10-CM | POA: Diagnosis not present

## 2022-04-16 DIAGNOSIS — J449 Chronic obstructive pulmonary disease, unspecified: Secondary | ICD-10-CM | POA: Diagnosis not present

## 2022-04-16 DIAGNOSIS — J309 Allergic rhinitis, unspecified: Secondary | ICD-10-CM | POA: Diagnosis not present

## 2022-04-16 DIAGNOSIS — Z0001 Encounter for general adult medical examination with abnormal findings: Secondary | ICD-10-CM | POA: Diagnosis not present

## 2022-04-16 DIAGNOSIS — M545 Low back pain, unspecified: Secondary | ICD-10-CM | POA: Diagnosis not present

## 2022-04-16 DIAGNOSIS — E1142 Type 2 diabetes mellitus with diabetic polyneuropathy: Secondary | ICD-10-CM | POA: Diagnosis not present

## 2022-04-16 DIAGNOSIS — F17200 Nicotine dependence, unspecified, uncomplicated: Secondary | ICD-10-CM | POA: Diagnosis not present

## 2022-04-16 DIAGNOSIS — E1140 Type 2 diabetes mellitus with diabetic neuropathy, unspecified: Secondary | ICD-10-CM | POA: Diagnosis not present

## 2022-04-16 DIAGNOSIS — F17210 Nicotine dependence, cigarettes, uncomplicated: Secondary | ICD-10-CM | POA: Diagnosis not present

## 2022-04-16 DIAGNOSIS — N1831 Chronic kidney disease, stage 3a: Secondary | ICD-10-CM | POA: Diagnosis not present

## 2022-04-16 DIAGNOSIS — E782 Mixed hyperlipidemia: Secondary | ICD-10-CM | POA: Diagnosis not present

## 2022-10-14 DIAGNOSIS — E782 Mixed hyperlipidemia: Secondary | ICD-10-CM | POA: Diagnosis not present

## 2022-10-14 DIAGNOSIS — E1140 Type 2 diabetes mellitus with diabetic neuropathy, unspecified: Secondary | ICD-10-CM | POA: Diagnosis not present

## 2022-10-21 DIAGNOSIS — M545 Low back pain, unspecified: Secondary | ICD-10-CM | POA: Diagnosis not present

## 2022-10-21 DIAGNOSIS — E1142 Type 2 diabetes mellitus with diabetic polyneuropathy: Secondary | ICD-10-CM | POA: Diagnosis not present

## 2022-10-21 DIAGNOSIS — E1122 Type 2 diabetes mellitus with diabetic chronic kidney disease: Secondary | ICD-10-CM | POA: Diagnosis not present

## 2022-10-21 DIAGNOSIS — E1140 Type 2 diabetes mellitus with diabetic neuropathy, unspecified: Secondary | ICD-10-CM | POA: Diagnosis not present

## 2022-10-21 DIAGNOSIS — J309 Allergic rhinitis, unspecified: Secondary | ICD-10-CM | POA: Diagnosis not present

## 2022-10-21 DIAGNOSIS — E782 Mixed hyperlipidemia: Secondary | ICD-10-CM | POA: Diagnosis not present

## 2022-10-21 DIAGNOSIS — J449 Chronic obstructive pulmonary disease, unspecified: Secondary | ICD-10-CM | POA: Diagnosis not present

## 2022-10-21 DIAGNOSIS — F17200 Nicotine dependence, unspecified, uncomplicated: Secondary | ICD-10-CM | POA: Diagnosis not present

## 2022-10-21 DIAGNOSIS — N1831 Chronic kidney disease, stage 3a: Secondary | ICD-10-CM | POA: Diagnosis not present

## 2022-10-21 DIAGNOSIS — F17210 Nicotine dependence, cigarettes, uncomplicated: Secondary | ICD-10-CM | POA: Diagnosis not present

## 2022-10-21 DIAGNOSIS — I129 Hypertensive chronic kidney disease with stage 1 through stage 4 chronic kidney disease, or unspecified chronic kidney disease: Secondary | ICD-10-CM | POA: Diagnosis not present

## 2022-11-19 DIAGNOSIS — M545 Low back pain, unspecified: Secondary | ICD-10-CM | POA: Diagnosis not present

## 2022-11-19 DIAGNOSIS — G8929 Other chronic pain: Secondary | ICD-10-CM | POA: Diagnosis not present

## 2022-11-19 DIAGNOSIS — M25511 Pain in right shoulder: Secondary | ICD-10-CM | POA: Diagnosis not present

## 2022-11-19 DIAGNOSIS — M25512 Pain in left shoulder: Secondary | ICD-10-CM | POA: Diagnosis not present

## 2022-12-31 ENCOUNTER — Ambulatory Visit: Payer: Medicare Other | Admitting: Orthopedic Surgery

## 2022-12-31 ENCOUNTER — Other Ambulatory Visit (INDEPENDENT_AMBULATORY_CARE_PROVIDER_SITE_OTHER): Payer: Medicare Other

## 2022-12-31 ENCOUNTER — Other Ambulatory Visit: Payer: Self-pay

## 2022-12-31 ENCOUNTER — Encounter: Payer: Self-pay | Admitting: Orthopedic Surgery

## 2022-12-31 VITALS — BP 150/87 | HR 90 | Ht 68.0 in | Wt 186.0 lb

## 2022-12-31 DIAGNOSIS — G8929 Other chronic pain: Secondary | ICD-10-CM | POA: Diagnosis not present

## 2022-12-31 DIAGNOSIS — M25512 Pain in left shoulder: Secondary | ICD-10-CM | POA: Diagnosis not present

## 2022-12-31 DIAGNOSIS — M25511 Pain in right shoulder: Secondary | ICD-10-CM | POA: Diagnosis not present

## 2022-12-31 NOTE — Patient Instructions (Signed)

## 2023-01-01 ENCOUNTER — Encounter: Payer: Self-pay | Admitting: Orthopedic Surgery

## 2023-01-01 NOTE — Progress Notes (Signed)
New Patient Visit  Assessment: Cody [name] is a 72 y.o. male with the following: 1. Chronic pain of both shoulder   Plan: Nazaiah Blaschko Malicoat has chronic pain in bilateral shoulders.  No specific injury.  Radiographs of both shoulders were obtained in clinic today.  Right is negative for acute or chronic appearing injury.  There might be some proximal humeral migration in the left shoulder, indicative of a chronic injury.  Nonetheless, injections have been effective in the past.  He would like to proceed with repeat injections today.  These were completed without issues.  He will follow-up as needed.   Procedure note injection - Right shoulder    Verbal consent was obtained to inject the right shoulder, subacromial space Timeout was completed to confirm the site of injection.   The skin was prepped with alcohol and ethyl chloride was sprayed at the injection site.  A 21-gauge needle was used to inject 40 mg of Depo-Medrol and 1% lidocaine (4 cc) into the subacromial space of the right shoulder using a posterolateral approach.  There were no complications.  A sterile bandage was applied.    Procedure note injection Left shoulder    Verbal consent was obtained to inject the left shoulder, subacromial space Timeout was completed to confirm the site of injection.  The skin was prepped with alcohol and ethyl chloride was sprayed at the injection site.  A 21-gauge needle was used to inject 40 mg of Depo-Medrol and 1% lidocaine (4 cc) into the subacromial space of the left shoulder using a posterolateral approach.  There were no complications. A sterile bandage was applied.   Follow-up: Return if symptoms worsen or fail to improve.  Subjective:  Chief Complaint  Patient presents with   Shoulder Pain    Bilat pain for over 10 yrs. Use to get injection at Dr. Scharlene Gloss office that could give injections but she left. R > L   NDC: [address]-1    History of Present Illness: Cody [name] is a 72  y.o. male who has been referred by Catalina Pizza, MD for evaluation of bilateral shoulder pain.  He states he has had pain in both shoulders for many years.  For years, he has gotten bilateral shoulder injections completed in Dr. Scharlene Gloss office, approximately every 6 months.  However, the provider who would complete the injections has left the office.  His most recent injection was almost a year ago.  He states he may have waited too long, ACE is currently having significant pain.  Pain is diffuse.  It does affect his motion.  No prior injuries.  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   Review of Systems: No fevers or chills No numbness or tingling No chest pain No shortness of breath No bowel or bladder dysfunction No GI distress No headaches   Medical History:  Past Medical History:  Diagnosis Date   Asthma    COPD (chronic obstructive pulmonary disease) (HCC)    Essential hypertension    Myocardial infarction (HCC) 01/05/2016   Nonischemic cardiomyopathy (HCC)    Pulmonary edema    Respiratory failure (HCC)    with hypoxia   Type 2 diabetes mellitus (HCC)     Past Surgical History:  Procedure Laterality Date   AMPUTATION  01/01/2012   Procedure: AMPUTATION DIGIT;  Surgeon: Tami Ribas, MD;  Location: [facility];  Service: Orthopedics;  Laterality: Right;  Right Ring and small finger revision of amputation, I&D and pinning  AMPUTATION Right 07/04/2016   Procedure: AMPUTATION RIGHT INDEX FINGER;  Surgeon: Vickki Hearing, MD;  Location: [facility];  Service: Orthopedics;  Laterality: Right;   APPENDECTOMY     BACK SURGERY     CLOSED REDUCTION FINGER WITH PERCUTANEOUS PINNING  01/01/2012   Procedure: CLOSED REDUCTION FINGER WITH PERCUTANEOUS PINNING;  Surgeon: Tami Ribas, MD;  Location: [facility];  Service: Orthopedics;  Laterality: Right;    Family History  Problem Relation Age of Onset   Heart failure Mother    Rheum arthritis Mother    Heart attack Father    Stroke Father     Social History   Tobacco Use   Smoking status: Every Day    Current packs/day: 1.50    Average packs/day: 1.5 packs/day for 55.1 years (82.6 ttl pk-yrs)    Types: Cigarettes    Start date: 12/04/1967   Smokeless tobacco: Never  Substance Use Topics   Alcohol use: Yes    Alcohol/week: 10.0 standard drinks of alcohol    Types: 10 Cans of beer per week    Comment: week   Drug use: No    Allergies  Allergen Reactions   Penicillins     Has patient had a PCN reaction causing immediate rash, facial/tongue/throat swelling, SOB or lightheadedness with hypotension:unknown Has patient had a PCN reaction causing severe rash involving mucus membranes or skin necrosis: unknown Has patient had a PCN reaction that required hospitalization; unknown Has patient had a PCN reaction occurring within the last 10 years; unknown If all of the above answers are "NO", then may proceed with Cephalosporin use.     Current Meds  Medication Sig   albuterol (PROAIR HFA) 108 (90 Base) MCG/ACT inhaler Inhale 1-2 puffs into the lungs every 6 (six) hours as needed for wheezing or shortness of breath.   ALPRAZolam (XANAX) 1 MG tablet Take 1 mg by mouth 3 (three) times daily.   aspirin EC 81 MG tablet Take 81 mg by mouth every morning.   carvedilol (COREG) 6.25 MG tablet TAKE ONE TABLET (6.25MG  TOTAL) BY MOUTH TWO TIMES DAILY   cephALEXin (KEFLEX) 500 MG capsule Take 1 capsule (500 mg total) by mouth 4 (four) times daily.   cephALEXin (KEFLEX) 500 MG capsule Take 1 capsule (500 mg total) by mouth 4 (four) times daily.   gabapentin (NEURONTIN) 300 MG capsule Take 1 capsule by mouth 2 (two) times daily.   hydrochlorothiazide (HYDRODIURIL) 25 MG tablet Take 1 tablet (25 mg total) by mouth daily.   Insulin Glargine (TOUJEO SOLOSTAR) 300 UNIT/ML SOPN Inject 32 Units into the skin daily.   JARDIANCE 10 MG TABS tablet Take 1 tablet by mouth daily.   metFORMIN (GLUCOPHAGE-XR) 500 MG 24 hr tablet Take 1,000 mg by  mouth 2 (two) times daily.   omeprazole (PRILOSEC) 20 MG capsule Take 20 mg by mouth daily.   oxyCODONE-acetaminophen (PERCOCET) 5-325 MG tablet Take 1-2 tablets by mouth every 6 (six) hours as needed.   pioglitazone (ACTOS) 15 MG tablet Take 15 mg by mouth daily.   rosuvastatin (CRESTOR) 5 MG tablet Take 2.5 mg by mouth daily.    sodium chloride (OCEAN) 0.65 % SOLN nasal spray Place 2 sprays into both nostrils as needed for congestion.   venlafaxine XR (EFFEXOR-XR) 150 MG 24 hr capsule Take 150 mg by mouth daily with breakfast.    Objective: BP (!) 150/87   Pulse 90   Ht 5\' 8"  (1.727 m)   Wt 186 lb (84.4 kg)  BMI 28.28 kg/m   Physical Exam:  General: Alert and oriented. and No acute distress. Gait: Normal gait.  Bilateral shoulders without deformity.  There is tenderness to palpation bilaterally.  Restricted range of motion due to pain.  He is able to resist, and has decent strength.  Sensation is intact distally.  Partial imitation of his small finger of the right hand.  2+ radial pulse.  IMAGING: I personally ordered and reviewed the following images  X-rays of bilateral shoulders were obtained in clinic today.  No acute injuries noted.  Minimal degenerative changes are noted within the glenohumeral joint.  Mild narrowing of the [facility] joint bilaterally.  No evidence of proximal humeral migration.  No bony lesions.  Impression: Negative bilateral shoulder x-rays   New Medications:  No orders of the defined types were placed in this encounter.     Oliver Barre, MD  01/01/2023 11:32 AM

## 2023-02-10 DIAGNOSIS — Z136 Encounter for screening for cardiovascular disorders: Secondary | ICD-10-CM | POA: Diagnosis not present

## 2023-02-10 DIAGNOSIS — I110 Hypertensive heart disease with heart failure: Secondary | ICD-10-CM | POA: Diagnosis not present

## 2023-02-10 DIAGNOSIS — F17210 Nicotine dependence, cigarettes, uncomplicated: Secondary | ICD-10-CM | POA: Diagnosis not present

## 2023-02-10 DIAGNOSIS — Z0000 Encounter for general adult medical examination without abnormal findings: Secondary | ICD-10-CM | POA: Diagnosis not present

## 2023-02-10 DIAGNOSIS — K219 Gastro-esophageal reflux disease without esophagitis: Secondary | ICD-10-CM | POA: Diagnosis not present

## 2023-02-10 DIAGNOSIS — Z79899 Other long term (current) drug therapy: Secondary | ICD-10-CM | POA: Diagnosis not present

## 2023-02-10 DIAGNOSIS — Z1159 Encounter for screening for other viral diseases: Secondary | ICD-10-CM | POA: Diagnosis not present

## 2023-02-10 DIAGNOSIS — J449 Chronic obstructive pulmonary disease, unspecified: Secondary | ICD-10-CM | POA: Diagnosis not present

## 2023-02-10 DIAGNOSIS — E785 Hyperlipidemia, unspecified: Secondary | ICD-10-CM | POA: Diagnosis not present

## 2023-02-10 DIAGNOSIS — I509 Heart failure, unspecified: Secondary | ICD-10-CM | POA: Diagnosis not present

## 2023-02-10 DIAGNOSIS — E1169 Type 2 diabetes mellitus with other specified complication: Secondary | ICD-10-CM | POA: Diagnosis not present

## 2023-03-17 ENCOUNTER — Ambulatory Visit: Payer: Medicare Other | Admitting: Orthopedic Surgery

## 2023-03-31 ENCOUNTER — Encounter: Payer: Self-pay | Admitting: Orthopedic Surgery

## 2023-03-31 ENCOUNTER — Ambulatory Visit: Payer: Medicare Other | Admitting: Orthopedic Surgery

## 2023-03-31 VITALS — BP 148/84 | HR 99 | Ht 68.0 in | Wt 187.0 lb

## 2023-03-31 DIAGNOSIS — M25511 Pain in right shoulder: Secondary | ICD-10-CM | POA: Diagnosis not present

## 2023-03-31 NOTE — Progress Notes (Signed)
Return patient Visit  Assessment: Cody [name] is a 73 y.o. male with the following: 1.  AC joint pain; decreased joint space with small osteophytes   Plan: [name] continues to have pain in the right shoulder.  Pain is in the superior aspect the shoulder.  It is localized to the area of the [facility] joint.  Radiographs were reviewed in clinic, which demonstrates some narrowing at the [facility] joint.  We have discussed proceeding with a localized AC joint injection.  This was completed in clinic today.  He will follow-up as needed.   Procedure note injection - Right shoulder -AC joint   Verbal consent was obtained to inject the right shoulder, AC joint  Timeout was completed to confirm the site of injection.   The skin was prepped with alcohol and ethyl chloride was sprayed at the injection site.  A 21-gauge needle was used to inject 40 mg of Depo-Medrol and 1% lidocaine (1 cc) into the [facility] joint of the right shoulder using a posterolateral approach.  There were no complications.  A sterile bandage was applied.    Follow-up: Return if symptoms worsen or fail to improve.  Subjective:  Chief Complaint  Patient presents with   Shoulder Pain    R shoulder pain no better after injection    History of Present Illness: Cody [name] is a 73 y.o. male who returns to clinic for repeat evaluation of right shoulder pain.  I saw him in clinic a couple of months ago.  At that time, both shoulders were injected.  He had an excellent response in his left shoulder.  However, he notes minimal improvement in pain in the right shoulder.  Pain is primarily in the superior aspect of the right shoulder.  He is unable to lay on his right shoulder.  Review of Systems: No fevers or chills No numbness or tingling No chest pain No shortness of breath No bowel or bladder dysfunction No GI distress No headaches     Objective: BP (!) 148/84   Pulse 99   Ht 5\' 8"  (1.727 m)   Wt 187 lb (84.8 kg)   BMI 28.43  kg/m   Physical Exam:  General: Alert and oriented. and No acute distress. Gait: Normal gait.  Right shoulder without deformity.  He has tenderness overlying the AC joint on the right.  Pain with cross body adduction.  Overall, his range of motion is pretty good although he does have some pain at extremes.  Fingers are warm and well-perfused.  IMAGING: I personally ordered and reviewed the following images No new imaging obtained today.   New Medications:  No orders of the defined types were placed in this encounter.     Oliver Barre, MD  03/31/2023 3:06 PM

## 2023-03-31 NOTE — Patient Instructions (Signed)

## 2023-11-23 ENCOUNTER — Encounter (HOSPITAL_COMMUNITY): Payer: Self-pay

## 2023-11-23 ENCOUNTER — Other Ambulatory Visit: Payer: Self-pay

## 2023-11-23 ENCOUNTER — Observation Stay (HOSPITAL_COMMUNITY)
Admission: EM | Admit: 2023-11-23 | Discharge: 2023-11-24 | Disposition: A | Discharge status: Home / Self Care | Attending: Family Medicine | Admitting: Family Medicine

## 2023-11-23 ENCOUNTER — Emergency Department (HOSPITAL_COMMUNITY)

## 2023-11-23 DIAGNOSIS — Z79899 Other long term (current) drug therapy: Secondary | ICD-10-CM | POA: Insufficient documentation

## 2023-11-23 DIAGNOSIS — I10 Essential (primary) hypertension: Secondary | ICD-10-CM | POA: Diagnosis not present

## 2023-11-23 DIAGNOSIS — R911 Solitary pulmonary nodule: Secondary | ICD-10-CM | POA: Diagnosis present

## 2023-11-23 DIAGNOSIS — R918 Other nonspecific abnormal finding of lung field: Secondary | ICD-10-CM | POA: Diagnosis not present

## 2023-11-23 DIAGNOSIS — Z7982 Long term (current) use of aspirin: Secondary | ICD-10-CM | POA: Diagnosis not present

## 2023-11-23 DIAGNOSIS — Z794 Long term (current) use of insulin: Secondary | ICD-10-CM

## 2023-11-23 DIAGNOSIS — G894 Chronic pain syndrome: Secondary | ICD-10-CM | POA: Diagnosis not present

## 2023-11-23 DIAGNOSIS — I160 Hypertensive urgency: Principal | ICD-10-CM | POA: Insufficient documentation

## 2023-11-23 DIAGNOSIS — R079 Chest pain, unspecified: Secondary | ICD-10-CM | POA: Diagnosis present

## 2023-11-23 DIAGNOSIS — F1090 Alcohol use, unspecified, uncomplicated: Secondary | ICD-10-CM | POA: Insufficient documentation

## 2023-11-23 DIAGNOSIS — E139 Other specified diabetes mellitus without complications: Secondary | ICD-10-CM

## 2023-11-23 DIAGNOSIS — F17210 Nicotine dependence, cigarettes, uncomplicated: Secondary | ICD-10-CM | POA: Insufficient documentation

## 2023-11-23 DIAGNOSIS — R0789 Other chest pain: Secondary | ICD-10-CM | POA: Diagnosis not present

## 2023-11-23 DIAGNOSIS — E119 Type 2 diabetes mellitus without complications: Secondary | ICD-10-CM | POA: Diagnosis not present

## 2023-11-23 DIAGNOSIS — J449 Chronic obstructive pulmonary disease, unspecified: Secondary | ICD-10-CM | POA: Diagnosis not present

## 2023-11-23 LAB — TROPONIN T, HIGH SENSITIVITY
Troponin T High Sensitivity: <15 ng/L (ref 0–19)
Troponin T High Sensitivity: <15 ng/L (ref 0–19)

## 2023-11-23 LAB — GLUCOSE, CAPILLARY: Glucose-Capillary: 112 mg/dL — ABNORMAL HIGH (ref 70–99)

## 2023-11-23 LAB — CBC
HCT: 46 % (ref 39.0–52.0)
Hemoglobin: 15.8 g/dL (ref 13.0–17.0)
MCH: 31.3 pg (ref 26.0–34.0)
MCHC: 34.3 g/dL (ref 30.0–36.0)
MCV: 91.3 fL (ref 80.0–100.0)
Platelets: 199 K/uL (ref 150–400)
RBC: 5.04 MIL/uL (ref 4.22–5.81)
RDW: 13.1 % (ref 11.5–15.5)
WBC: 5 K/uL (ref 4.0–10.5)
nRBC: 0 % (ref 0.0–0.2)

## 2023-11-23 LAB — BASIC METABOLIC PANEL WITH GFR
Anion gap: 10 (ref 5–15)
BUN: 11 mg/dL (ref 8–23)
CO2: 26 mmol/L (ref 22–32)
Calcium: 8.9 mg/dL (ref 8.9–10.3)
Chloride: 105 mmol/L (ref 98–111)
Creatinine, Ser: 0.81 mg/dL (ref 0.61–1.24)
GFR, Estimated: >60 mL/min (ref >60)
Glucose, Bld: 96 mg/dL (ref 70–99)
Potassium: 3.2 mmol/L — ABNORMAL LOW (ref 3.5–5.1)
Sodium: 141 mmol/L (ref 135–145)

## 2023-11-23 LAB — PRO BRAIN NATRIURETIC PEPTIDE: Pro Brain Natriuretic Peptide: 64.7 pg/mL (ref <300.0)

## 2023-11-23 MED ORDER — FLUTICASONE PROPIONATE 50 MCG/ACT NA SUSP
2.0000 | Freq: Every day | NASAL | Status: DC
  Administered 2023-11-24: 2 via NASAL
  Filled 2023-11-23: qty 16

## 2023-11-23 MED ORDER — INFLUENZA VAC SPLIT HIGH-DOSE 0.5 ML IM SUSY: 0.5000 mL | PREFILLED_SYRINGE | INTRAMUSCULAR | Status: DC

## 2023-11-23 MED ORDER — ALBUTEROL SULFATE (2.5 MG/3ML) 0.083% IN NEBU: 3.0000 mL | INHALATION_SOLUTION | Freq: Four times a day (QID) | RESPIRATORY_TRACT | Status: DC | PRN

## 2023-11-23 MED ORDER — HYDRALAZINE HCL 20 MG/ML IJ SOLN
10.0000 mg | Freq: Once | INTRAMUSCULAR | Status: AC
  Administered 2023-11-23: 10 mg via INTRAVENOUS
  Filled 2023-11-23: qty 1

## 2023-11-23 MED ORDER — IOHEXOL 350 MG/ML SOLN
100.0000 mL | Freq: Once | INTRAVENOUS | Status: AC | PRN
  Administered 2023-11-23: 100 mL via INTRAVENOUS

## 2023-11-23 MED ORDER — AMLODIPINE BESYLATE 5 MG PO TABS
10.0000 mg | ORAL_TABLET | Freq: Every day | ORAL | Status: DC
Start: 2023-11-23 — End: 2023-11-24
  Administered 2023-11-23 – 2023-11-24 (×2): 10 mg via ORAL
  Filled 2023-11-23 (×2): qty 2

## 2023-11-23 MED ORDER — NITROGLYCERIN 0.4 MG SL SUBL
0.4000 mg | SUBLINGUAL_TABLET | SUBLINGUAL | Status: DC | PRN
  Administered 2023-11-23: 0.4 mg via SUBLINGUAL
  Filled 2023-11-23: qty 1

## 2023-11-23 MED ORDER — ONDANSETRON HCL 4 MG PO TABS: 4.0000 mg | ORAL_TABLET | Freq: Four times a day (QID) | ORAL | Status: DC | PRN

## 2023-11-23 MED ORDER — INSULIN ASPART 100 UNIT/ML IJ SOLN
0.0000 [IU] | Freq: Three times a day (TID) | INTRAMUSCULAR | Status: DC
  Administered 2023-11-24: 2 [IU] via SUBCUTANEOUS

## 2023-11-23 MED ORDER — INSULIN GLARGINE 100 UNIT/ML ~~LOC~~ SOLN
40.0000 [IU] | Freq: Every morning | SUBCUTANEOUS | Status: DC
  Filled 2023-11-23 (×2): qty 0.4

## 2023-11-23 MED ORDER — ALPRAZOLAM 1 MG PO TABS
1.0000 mg | ORAL_TABLET | Freq: Once | ORAL | Status: AC
  Administered 2023-11-23: 1 mg via ORAL
  Filled 2023-11-23: qty 1
  Filled 2023-11-23: qty 2

## 2023-11-23 MED ORDER — ACETAMINOPHEN 325 MG PO TABS
650.0000 mg | ORAL_TABLET | Freq: Four times a day (QID) | ORAL | Status: DC | PRN
Start: 2023-11-23 — End: 2023-11-24
  Administered 2023-11-23 – 2023-11-24 (×2): 650 mg via ORAL
  Filled 2023-11-23: qty 2

## 2023-11-23 MED ORDER — PANTOPRAZOLE SODIUM 40 MG PO TBEC
40.0000 mg | DELAYED_RELEASE_TABLET | Freq: Every morning | ORAL | Status: DC
  Administered 2023-11-24: 40 mg via ORAL
  Filled 2023-11-23: qty 1

## 2023-11-23 MED ORDER — POTASSIUM CHLORIDE CRYS ER 20 MEQ PO TBCR
40.0000 meq | EXTENDED_RELEASE_TABLET | Freq: Four times a day (QID) | ORAL | Status: AC
  Administered 2023-11-23: 40 meq via ORAL
  Filled 2023-11-23 (×2): qty 2

## 2023-11-23 MED ORDER — ASPIRIN 81 MG PO CHEW
324.0000 mg | CHEWABLE_TABLET | Freq: Once | ORAL | Status: AC
  Administered 2023-11-23: 324 mg via ORAL
  Filled 2023-11-23: qty 4

## 2023-11-23 MED ORDER — OXYCODONE-ACETAMINOPHEN 7.5-325 MG PO TABS
1.0000 | ORAL_TABLET | Freq: Three times a day (TID) | ORAL | Status: DC | PRN
  Administered 2023-11-24: 1 via ORAL
  Filled 2023-11-23: qty 1

## 2023-11-23 MED ORDER — ONDANSETRON HCL 4 MG/2ML IJ SOLN: 4.0000 mg | Freq: Four times a day (QID) | INTRAMUSCULAR | Status: DC | PRN

## 2023-11-23 MED ORDER — HYDRALAZINE HCL 20 MG/ML IJ SOLN
10.0000 mg | INTRAMUSCULAR | Status: DC | PRN
Start: 2023-11-23 — End: 2023-11-24

## 2023-11-23 MED ORDER — ACETAMINOPHEN 325 MG PO TABS
650.0000 mg | ORAL_TABLET | Freq: Four times a day (QID) | ORAL | Status: DC | PRN
  Filled 2023-11-23: qty 2

## 2023-11-23 MED ORDER — LISINOPRIL 10 MG PO TABS
20.0000 mg | ORAL_TABLET | Freq: Every day | ORAL | Status: DC
  Administered 2023-11-23: 20 mg via ORAL
  Filled 2023-11-23: qty 2

## 2023-11-23 MED ORDER — UMECLIDINIUM-VILANTEROL 62.5-25 MCG/ACT IN AEPB
1.0000 | INHALATION_SPRAY | Freq: Every day | RESPIRATORY_TRACT | Status: DC
  Administered 2023-11-24: 1 via RESPIRATORY_TRACT
  Filled 2023-11-23: qty 14

## 2023-11-23 MED ORDER — ENOXAPARIN SODIUM 40 MG/0.4ML IJ SOSY
40.0000 mg | PREFILLED_SYRINGE | INTRAMUSCULAR | Status: DC
  Administered 2023-11-24: 40 mg via SUBCUTANEOUS
  Filled 2023-11-23: qty 0.4

## 2023-11-23 MED ORDER — OXYCODONE-ACETAMINOPHEN 7.5-325 MG PO TABS
1.0000 | ORAL_TABLET | Freq: Once | ORAL | Status: AC
  Administered 2023-11-23: 1 via ORAL

## 2023-11-23 MED ORDER — ACETAMINOPHEN 325 MG PO TABS
650.0000 mg | ORAL_TABLET | Freq: Once | ORAL | Status: AC
  Administered 2023-11-23: 650 mg via ORAL
  Filled 2023-11-23: qty 2

## 2023-11-23 MED ORDER — ASPIRIN 81 MG PO TBEC
81.0000 mg | DELAYED_RELEASE_TABLET | Freq: Every morning | ORAL | Status: DC
  Administered 2023-11-24: 81 mg via ORAL
  Filled 2023-11-23: qty 1

## 2023-11-23 MED ORDER — MORPHINE SULFATE (PF) 4 MG/ML IV SOLN
4.0000 mg | Freq: Once | INTRAVENOUS | Status: AC
  Administered 2023-11-23: 4 mg via INTRAVENOUS
  Filled 2023-11-23: qty 1

## 2023-11-23 MED ORDER — ROSUVASTATIN CALCIUM 20 MG PO TABS
20.0000 mg | ORAL_TABLET | Freq: Every day | ORAL | Status: DC
  Administered 2023-11-24: 20 mg via ORAL
  Filled 2023-11-23: qty 1

## 2023-11-23 MED ORDER — POTASSIUM CHLORIDE CRYS ER 20 MEQ PO TBCR
40.0000 meq | EXTENDED_RELEASE_TABLET | Freq: Once | ORAL | Status: AC
  Administered 2023-11-24: 40 meq via ORAL
  Filled 2023-11-23: qty 2

## 2023-11-23 MED ORDER — INSULIN ASPART 100 UNIT/ML IJ SOLN: 0.0000 [IU] | Freq: Every day | INTRAMUSCULAR | Status: DC

## 2023-11-23 MED ORDER — POLYETHYLENE GLYCOL 3350 17 G PO PACK: 17.0000 g | PACK | Freq: Every day | ORAL | Status: DC | PRN

## 2023-11-23 MED ORDER — ACETAMINOPHEN 650 MG RE SUPP: 650.0000 mg | Freq: Four times a day (QID) | RECTAL | Status: DC | PRN

## 2023-11-23 NOTE — Assessment & Plan Note (Signed)
 In the setting of hypertensive urgency with blood pressure of 236/107.  CTA and pelvis negative for dissection, or aortic aneurysm.  Troponin < 15 x 2, EKG without acute changes.  At this time no objective evidence of ischemia. -Evaluated by cardiology, obtain echo, regularly bring down blood pressure. -Resume aspirin , Crestor 

## 2023-11-23 NOTE — ED Triage Notes (Signed)
 Pt arrived via POV from home c/o chest pain that began apprx 90 minutes PTA. Pt reports he ran out of his BP medications late last week. Pt describes chest pain as feeling very heavy.

## 2023-11-23 NOTE — Consult Note (Signed)
 Cardiology Consultation   Patient ID: Cody [name] MRN: [id number]; DOB: 1950-06-25  Admit date: 11/23/2023 Date of Consult: 11/23/2023  PCP:  Arloa Jarvis, NP   [facility] Providers Cardiologist:  Debera   Patient Profile: Cody [name] is a 73 y.o. male with a hx of HTN, prior mild LV systolic dysfunction that resolved, COPD, DM2 who is being seen 11/23/2023 for the evaluation of chest pain at the request of Dr Towana.  History of Present Illness: [name] 73 yo male history of CODP, HTN, DM2, prior mild LV systolic dysfunction at 45% that later normalized. The low EF was in the setting of HTN emergency and COPD exacerbation at the time. Presents with chest pain.  Episode occurred while walking from his barn. 5/10 chest pressure with some associated diaphoresis, SOB. He is not sure how long it lasted, with ongoing symptoms presented to the ER.    ER vitals p 78 bp 236/107 K 3.2 Cr 0.81 BUN 11 WBC 5 Hgb 15.8 Plt 199 ProBNP 65 Trop neg x 2.  EKG SR, LAFB CXR: no acute process CTA C/A/P: no acute aortic pathology, 1.8 cm lung nodule      Past Medical History:  Diagnosis Date   Asthma    COPD (chronic obstructive pulmonary disease) (HCC)    Essential hypertension    Myocardial infarction (HCC) 01/05/2016   Nonischemic cardiomyopathy (HCC)    Pulmonary edema    Respiratory failure (HCC)    with hypoxia   Type 2 diabetes mellitus (HCC)     Past Surgical History:  Procedure Laterality Date   AMPUTATION  01/01/2012   Procedure: AMPUTATION DIGIT;  Surgeon: Franky JONELLE Curia, MD;  Location: [facility] OR;  Service: Orthopedics;  Laterality: Right;  Right Ring and small finger revision of amputation, I&D and pinning   AMPUTATION Right 07/04/2016   Procedure: AMPUTATION RIGHT INDEX FINGER;  Surgeon: Margrette Taft BRAVO, MD;  Location: [facility];  Service: Orthopedics;  Laterality: Right;   APPENDECTOMY     BACK SURGERY     CLOSED REDUCTION FINGER WITH PERCUTANEOUS PINNING   01/01/2012   Procedure: CLOSED REDUCTION FINGER WITH PERCUTANEOUS PINNING;  Surgeon: Franky JONELLE Curia, MD;  Location: [facility];  Service: Orthopedics;  Laterality: Right;     Scheduled Meds:  ALPRAZolam   1 mg Oral Once   oxyCODONE -acetaminophen   1 tablet Oral Once   Continuous Infusions:  PRN Meds: nitroGLYCERIN   Allergies:    Allergies  Allergen Reactions   Penicillins Other (See Comments)    Unknown      Social History:   Social History   Socioeconomic History   Marital status: Divorced    Spouse name: Not on file   Number of children: Not on file   Years of education: Not on file   Highest education level: Not on file  Occupational History   Not on file  Tobacco Use   Smoking status: Every Day    Current packs/day: 1.50    Average packs/day: 1.5 packs/day for 56.0 years (84.0 ttl pk-yrs)    Types: Cigarettes    Start date: 12/04/1967   Smokeless tobacco: Never  Vaping Use   Vaping status: Never Used  Substance and Sexual Activity   Alcohol use: Yes    Alcohol/week: 10.0 standard drinks of alcohol    Types: 10 Cans of beer per week    Comment: week   Drug use: No   Sexual activity: Not on file  Other Topics Concern  Not on file  Social History Narrative   Not on file   Social Drivers of Health   Financial Resource Strain: Not on file  Food Insecurity: Not on file  Transportation Needs: Not on file  Physical Activity: Not on file  Stress: Not on file  Social Connections: Not on file  Intimate Partner Violence: Not on file    Family History:    Family History  Problem Relation Age of Onset   Heart failure Mother    Rheum arthritis Mother    Heart attack Father    Stroke Father      ROS:  Please see the history of present illness.   All other ROS reviewed and negative.     Physical Exam/Data: Vitals:   11/23/23 1100 11/23/23 1130 11/23/23 1230 11/23/23 1430  BP: (!) 177/95 (!) 191/81 (!) 178/76   Pulse: 74 61 (!) 51 (!) 55  Resp: 19 15 16  19   Temp:      TempSrc:      SpO2: 93% 96% 97% 95%  Weight:      Height:       No intake or output data in the 24 hours ending 11/23/23 1529    11/23/2023   10:43 AM 03/31/2023    2:33 PM 12/31/2022   10:46 AM  Last 3 Weights  Weight (lbs) 186 lb 15.2 oz 187 lb 186 lb  Weight (kg) 84.8 kg 84.823 kg 84.369 kg     Body mass index is 28.43 kg/m.  General:  Well nourished, well developed, in no acute distress HEENT: normal Neck: no JVD Vascular: No carotid bruits; Distal pulses 2+ bilaterally Cardiac:  normal S1, S2; RRR; no murmur  Lungs:  clear to auscultation bilaterally, no wheezing, rhonchi or rales  Abd: soft, nontender, no hepatomegaly  Ext: no edema Musculoskeletal:  No deformities, BUE and BLE strength normal and equal Skin: warm and dry  Neuro:  CNs 2-12 intact, no focal abnormalities noted Psych:  Normal affect     Laboratory Data: High Sensitivity Troponin:  No results for input(s): TROPONINIHS in the last 720 hours.   Chemistry Recent Labs  Lab 11/23/23 1043  NA 141  K 3.2*  CL 105  CO2 26  GLUCOSE 96  BUN 11  CREATININE 0.81  CALCIUM  8.9  GFRNONAA >60  ANIONGAP 10    No results for input(s): PROT, ALBUMIN, AST, ALT, ALKPHOS, BILITOT in the last 168 hours. Lipids No results for input(s): CHOL, TRIG, HDL, LABVLDL, LDLCALC, CHOLHDL in the last 168 hours.  Hematology Recent Labs  Lab 11/23/23 1043  WBC 5.0  RBC 5.04  HGB 15.8  HCT 46.0  MCV 91.3  MCH 31.3  MCHC 34.3  RDW 13.1  PLT 199   Thyroid  No results for input(s): TSH, FREET4 in the last 168 hours.  BNP Recent Labs  Lab 11/23/23 1043  PROBNP 64.7    DDimer No results for input(s): DDIMER in the last 168 hours.  Radiology/Studies:  CT Angio Chest/Abd/Pel for Dissection W and/or W/WO Result Date: 11/23/2023 CLINICAL DATA:  Chest pain today. Acute aortic syndrome suspected. Patient reports running out of blood pressure medications last week. EXAM: CT  ANGIOGRAPHY CHEST, ABDOMEN AND PELVIS TECHNIQUE: Non-contrast CT of the chest was initially obtained. Multidetector CT imaging through the chest, abdomen and pelvis was performed using the standard protocol during bolus administration of intravenous contrast. Multiplanar reconstructed images and MIPs were obtained and reviewed to evaluate the vascular anatomy. RADIATION DOSE REDUCTION: This  exam was performed according to the departmental dose-optimization program which includes automated exposure control, adjustment of the mA and/or kV according to patient size and/or use of iterative reconstruction technique. CONTRAST:  OMNIPAQUE IOHEXOL 350 MG/ML SOLN COMPARISON:  Chest radiographs 11/23/2023 and 11/04/2021. Abdominopelvic CT 08/14/2006. FINDINGS: Cardiovascular: No acute vascular findings are demonstrated. There is atherosclerosis of the aorta, great vessels and coronary arteries. No evidence of acute pulmonary embolism. No evidence of thoracic aortic aneurysm or dissection. The heart size is normal. There is no pericardial effusion. Mediastinum/Nodes: There are no enlarged mediastinal, hilar or axillary lymph nodes. The thyroid  gland, trachea and esophagus demonstrate no significant findings. Lungs/Pleura: No pleural effusion or pneumothorax. There is a part solid subpleural nodule in the right lower lobe which measures 1.8 x 1.2 cm on image 105/7. This nodule is new compared with the remote abdominal CT and has a morphology concerning for adenocarcinoma. Focal infection/inflammation considered less likely. No other suspicious pulmonary nodules. Musculoskeletal/Chest wall: The bones are demineralized. There are mild compression deformities at T6 and T7, likely present on 11/04/2021 radiographs. No definite acute osseous findings. No suspicious chest wall findings. Review of the MIP images confirms the above findings. CTA ABDOMEN AND PELVIS FINDINGS VASCULAR Aorta: Mild atherosclerosis. No evidence of  aneurysm, dissection or significant stenosis. Celiac: Patent without evidence of aneurysm, dissection or significant stenosis. SMA: Patent without evidence of aneurysm, dissection or significant stenosis. Renals: Both renal arteries are patent without evidence of aneurysm, dissection or significant stenosis. IMA: Patent without evidence of aneurysm, dissection, vasculitis or significant stenosis. Inflow: Patent without evidence of aneurysm, dissection, vasculitis or significant stenosis. Veins: No obvious venous abnormality within the limitations of this arterial phase study. Review of the MIP images confirms the above findings. NON-VASCULAR FINDINGS Hepatobiliary: The liver is normal in density without suspicious focal abnormality. No evidence of gallstones, gallbladder wall thickening or biliary dilatation. Pancreas: Unremarkable. No pancreatic ductal dilatation or surrounding inflammatory changes. Spleen: Normal in size without focal abnormality. Adrenals/Urinary Tract: Both adrenal glands appear normal. The kidneys appear normal without evidence of urinary tract calculus, suspicious lesion or hydronephrosis. The bladder appears normal for its degree of distention. Stomach/Bowel: No enteric contrast administered. The stomach appears unremarkable for its degree of distension. No evidence of bowel wall thickening, distention or surrounding inflammatory change. Status post appendectomy. Diverticular changes within the sigmoid colon without surrounding acute inflammation. Lymphatic: There are no enlarged abdominal or pelvic lymph nodes. Reproductive: The prostate gland and seminal vesicles appear unremarkable. Other: Asymmetric fat in the right inguinal canal. Tiny umbilical hernia containing only fat. No herniated bowel, ascites or pneumoperitoneum. Musculoskeletal: No acute or significant osseous findings. Stable chronic L1 compression deformity. Review of the MIP images confirms the above findings. IMPRESSION: 1.  No acute vascular findings in the chest, abdomen or pelvis. No evidence of aortic aneurysm or dissection. 2. Part solid subpleural nodule in the right lower lobe measuring up to 1.8 cm in diameter, new from remote abdominal CT and morphologically concerning for adenocarcinoma. Focal infection/inflammation considered less likely. Recommend referral to multi disciplinary thoracic oncology clinic. Consider one of the following in 3 months for both low-risk and high-risk individuals: (a) repeat chest CT, (b) follow-up PET-CT, or (c) tissue sampling. This recommendation follows the consensus statement: Guidelines for Management of Incidental Pulmonary Nodules Detected on CT Images: From the Fleischner Society 2017; Radiology 2017; 284:228-243. 3. Sigmoid diverticulosis without evidence of acute inflammation. 4. Grossly stable chronic thoracolumbar compression deformities. 5.  Aortic Atherosclerosis (ICD10-I70.0). Electronically  Signed   By: Elsie Perone M.D.   On: 11/23/2023 14:25   DG Chest Port 1 View Result Date: 11/23/2023 CLINICAL DATA:  Chest pain. EXAM: PORTABLE CHEST 1 VIEW COMPARISON:  November 04, 2021 FINDINGS: The heart size and mediastinal contours are within normal limits. No acute infiltrate, pleural effusion or pneumothorax is identified. The visualized skeletal structures are unremarkable. IMPRESSION: No active disease. Electronically Signed   By: Suzen Dials M.D.   On: 11/23/2023 11:35     Assessment and Plan: 1.Chest pain - suspect related to severe HTN on presentation, 236/107 - no objective evidence of ischemia by EKG or enzymes - CTA without acute aortic pathology - f/u echo results. Start back home oral medications and monitor symptoms  2. HTN - reports ran out of home bp meds last week - severe HTN on presentation - gradually bring down bp, I see no indication for rapid lowering. Would restart his home regimen over today and tomorrow. For now start norvasc at higher  dose of 10mg , lisinopril 20mg  daily. Follow bp's and continue to add back home regimen tomorrow.   3.Lung nodule - noted on CT scan, per primary team  4.Prior history of mild LV systolic dysfunction - seen in 2018 for transient low EF of 45% in setting of HTN emergency and COPD exacerbation - at f/u LVEF had normalized, likely transient stress induced dysfunction - f/u echo this admission.           For questions or updates, please contact [facility] Please consult [email or web address] for contact info under      Signed, Alvan Carrier, MD  11/23/2023 3:29 PM

## 2023-11-23 NOTE — H&P (Signed)
 History and Physical    [name]:[id number] DOB: [birth year] DOA: 11/23/2023  PCP: Arloa Jarvis, NP   Patient coming from: Home  I have personally briefly reviewed patient's old medical records in [facility]  Chief Complaint: Chest pressure  HPI: Cody [name] is a 73 y.o. male with medical history significant for COPD and asthma, hypertension, diabetes mellitus. Patient presented to the ED with complaints of chest pressure with associated diaphoresis, suddenly feeling hot and started about an hour prior to arrival in the ED.  Patient was walking from his barn when this started.  At baseline, patient is very active, as he works on his farm-he reports expected aches and pains involving his shoulders but he does not believe he has had any chest pains with activity in the past before today. Patient reports he ran out of his blood pressure medications with first 1 run out about a week ago.  A couple other medications were going to run out, so he was waiting to just pick up all 3 prescriptions at the same time.  Normally his blood pressure is not elevated. No change in his breathing.  He has chronic unchanged cough.  No fevers no chills. Patient is currently chest pain-free.  ED Course: Temperature 97.6.  Heart rate 51-81.  Respiratory rate 15-19.  Blood pressure elevated to 236, improved with 10 mg of hydralazine. Troponin < 15 x 2 Potassium 3.2. EKG without acute changes. CTA negative for acute abnormality. Cardiology consulted, no objective evidence of ischemia by EKG or enzymes, gradually bring down blood pressure, restart his home regimen.  Obtain echo. IV hydralazine 10 mg x 1 given.   Review of Systems: As per HPI all other systems reviewed and negative.  Past Medical History:  Diagnosis Date   Asthma    COPD (chronic obstructive pulmonary disease) (HCC)    Essential hypertension    Myocardial infarction (HCC) 01/05/2016   Nonischemic cardiomyopathy (HCC)     Pulmonary edema    Respiratory failure (HCC)    with hypoxia   Type 2 diabetes mellitus (HCC)     Past Surgical History:  Procedure Laterality Date   AMPUTATION  01/01/2012   Procedure: AMPUTATION DIGIT;  Surgeon: Franky JONELLE Curia, MD;  Location: [facility] OR;  Service: Orthopedics;  Laterality: Right;  Right Ring and small finger revision of amputation, I&D and pinning   AMPUTATION Right 07/04/2016   Procedure: AMPUTATION RIGHT INDEX FINGER;  Surgeon: Margrette Taft BRAVO, MD;  Location: [facility] ORS;  Service: Orthopedics;  Laterality: Right;   APPENDECTOMY     BACK SURGERY     CLOSED REDUCTION FINGER WITH PERCUTANEOUS PINNING  01/01/2012   Procedure: CLOSED REDUCTION FINGER WITH PERCUTANEOUS PINNING;  Surgeon: Franky JONELLE Curia, MD;  Location: [facility] OR;  Service: Orthopedics;  Laterality: Right;     reports that he has been smoking cigarettes. He started smoking about 56 years ago. He has a 84 pack-year smoking history. He has never used smokeless tobacco. He reports current alcohol use of about 10.0 standard drinks of alcohol per week. He reports that he does not use drugs.  Allergies  Allergen Reactions   Penicillins Other (See Comments)    Unknown      Family History  Problem Relation Age of Onset   Heart failure Mother    Rheum arthritis Mother    Heart attack Father    Stroke Father    Prior to Admission medications   Medication Sig Start Date End Date  Taking? Authorizing Provider  albuterol  (PROAIR  HFA) 108 (90 Base) MCG/ACT inhaler Inhale 1-2 puffs into the lungs every 6 (six) hours as needed for wheezing or shortness of breath.    [provider]  ALPRAZolam  (XANAX ) 1 MG tablet Take 1 mg by mouth 3 (three) times daily.    [provider]  amLODipine (NORVASC) 5 MG tablet Take 5 mg by mouth every morning. 07/16/23   [provider]  VIVIA BECK 62.5-25 MCG/ACT AEPB Inhale 1 puff into the lungs daily. 09/23/23   [provider]  aspirin  EC 81 MG tablet Take  81 mg by mouth every morning.    [provider]  carvedilol  (COREG ) 6.25 MG tablet TAKE ONE TABLET (6.25MG  TOTAL) BY MOUTH TWO TIMES DAILY 07/20/17   Charls Pearla LABOR, MD  cephALEXin  (KEFLEX ) 500 MG capsule Take 1 capsule (500 mg total) by mouth 4 (four) times daily. 07/01/16   Geroldine Berg, MD  cephALEXin  (KEFLEX ) 500 MG capsule Take 1 capsule (500 mg total) by mouth 4 (four) times daily. 07/23/16   Brenna Lin, MD  ezetimibe (ZETIA) 10 MG tablet Take 10 mg by mouth every morning. 09/08/23   [provider]  fluticasone (FLONASE) 50 MCG/ACT nasal spray Place 2 sprays into both nostrils daily. 10/26/23   [provider]  gabapentin (NEURONTIN) 300 MG capsule Take 1 capsule by mouth 2 (two) times daily. 04/10/16   [provider]  hydrochlorothiazide  (HYDRODIURIL ) 25 MG tablet Take 1 tablet (25 mg total) by mouth daily. 01/08/16   Ladora Coy, MD  JARDIANCE 25 MG TABS tablet Take 25 mg by mouth every morning. 11/11/23   [provider]  lisinopril (ZESTRIL) 20 MG tablet Take 20 mg by mouth daily. 08/24/23   [provider]  metFORMIN  (GLUCOPHAGE -XR) 500 MG 24 hr tablet Take 1,000 mg by mouth 2 (two) times daily.    [provider]  omeprazole (PRILOSEC) 20 MG capsule Take 20 mg by mouth daily.    [provider]  oxyCODONE -acetaminophen  (PERCOCET) 7.5-325 MG tablet Take 1 tablet by mouth every 6 (six) hours as needed for moderate pain (pain score 4-6). 11/17/23   [provider]  pantoprazole  (PROTONIX ) 40 MG tablet Take 40 mg by mouth every morning. 08/15/23   [provider]  pioglitazone  (ACTOS ) 15 MG tablet Take 15 mg by mouth daily.    [provider]  rosuvastatin  (CRESTOR ) 20 MG tablet Take 20 mg by mouth daily. 10/19/23   [provider]  sodium chloride  (OCEAN) 0.65 % SOLN nasal spray Place 2 sprays into both nostrils as needed for congestion.    [provider]  TOUJEO  SOLOSTAR 300  UNIT/ML Solostar Pen Inject 75-85 Units into the skin in the morning. 10/29/23   [provider]  venlafaxine  XR (EFFEXOR -XR) 75 MG 24 hr capsule Take 225 mg by mouth daily. 09/24/23   [provider]    Physical Exam: Vitals:   11/23/23 1430 11/23/23 1531 11/23/23 1600 11/23/23 1625  BP:  (!) 199/109 (!) 198/105 (!) 192/75  Pulse: (!) 55   61  Resp: 19 (!) 27 17 18   Temp:    97.8 F (36.6 C)  TempSrc:    Oral  SpO2: 95%   98%  Weight:      Height:        Constitutional: NAD, calm, comfortable Vitals:   11/23/23 1430 11/23/23 1531 11/23/23 1600 11/23/23 1625  BP:  (!) 199/109 (!) 198/105 (!) 192/75  Pulse: ROLLEN)  55   61  Resp: 19 (!) 27 17 18   Temp:    97.8 F (36.6 C)  TempSrc:    Oral  SpO2: 95%   98%  Weight:      Height:       Eyes: PERRL, lids and conjunctivae normal ENMT: Mucous membranes are moist. Posterior pharynx clear of any exudate or lesions.Normal dentition.  Neck: normal, supple, no masses, no thyromegaly Respiratory: clear to auscultation bilaterally, no wheezing, no crackles. Normal respiratory effort. No accessory muscle use.  Cardiovascular: Regular rate and rhythm, no murmurs / rubs / gallops. No extremity edema. 2+ pedal pulses. No carotid bruits.  Abdomen: no tenderness, no masses palpated. No hepatosplenomegaly. Bowel sounds positive.  Musculoskeletal: no clubbing / cyanosis. No joint deformity upper and lower extremities. Good ROM, no contractures. Normal muscle tone.  Skin: no rashes, lesions, ulcers. No induration Neurologic: CN 2-12 grossly intact. Sensation intact, DTR normal. Strength 5/5 in all 4.  Psychiatric: Normal judgment and insight. Alert and oriented x 3. Normal mood.   Labs on Admission: I have personally reviewed following labs and imaging studies  CBC: Recent Labs  Lab 11/23/23 1043  WBC 5.0  HGB 15.8  HCT 46.0  MCV 91.3  PLT 199   Basic Metabolic Panel: Recent Labs  Lab 11/23/23 1043  NA 141  K 3.2*   CL 105  CO2 26  GLUCOSE 96  BUN 11  CREATININE 0.81  CALCIUM  8.9   BNP (last 3 results) Recent Labs    11/23/23 1043  PROBNP 64.7   Urine analysis:    Component Value Date/Time   COLORURINE YELLOW 08/14/2006 1103   APPEARANCEUR CLEAR 08/14/2006 1103   LABSPEC 1.020 08/14/2006 1103   PHURINE 5.5 08/14/2006 1103   GLUCOSEU 250 (A) 08/14/2006 1103   HGBUR LARGE (A) 08/14/2006 1103   BILIRUBINUR NEGATIVE 08/14/2006 1103   KETONESUR NEGATIVE 08/14/2006 1103   PROTEINUR NEGATIVE 08/14/2006 1103   UROBILINOGEN 0.2 08/14/2006 1103   NITRITE NEGATIVE 08/14/2006 1103   LEUKOCYTESUR NEGATIVE 08/14/2006 1103    Radiological Exams on Admission: CT Angio Chest/Abd/Pel for Dissection W and/or W/WO Result Date: 11/23/2023 CLINICAL DATA:  Chest pain today. Acute aortic syndrome suspected. Patient reports running out of blood pressure medications last week. EXAM: CT ANGIOGRAPHY CHEST, ABDOMEN AND PELVIS TECHNIQUE: Non-contrast CT of the chest was initially obtained. Multidetector CT imaging through the chest, abdomen and pelvis was performed using the standard protocol during bolus administration of intravenous contrast. Multiplanar reconstructed images and MIPs were obtained and reviewed to evaluate the vascular anatomy. RADIATION DOSE REDUCTION: This exam was performed according to the departmental dose-optimization program which includes automated exposure control, adjustment of the mA and/or kV according to patient size and/or use of iterative reconstruction technique. CONTRAST:  OMNIPAQUE IOHEXOL 350 MG/ML SOLN COMPARISON:  Chest radiographs 11/23/2023 and 11/04/2021. Abdominopelvic CT 08/14/2006. FINDINGS: Cardiovascular: No acute vascular findings are demonstrated. There is atherosclerosis of the aorta, great vessels and coronary arteries. No evidence of acute pulmonary embolism. No evidence of thoracic aortic aneurysm or dissection. The heart size is normal. There is no pericardial  effusion. Mediastinum/Nodes: There are no enlarged mediastinal, hilar or axillary lymph nodes. The thyroid  gland, trachea and esophagus demonstrate no significant findings. Lungs/Pleura: No pleural effusion or pneumothorax. There is a part solid subpleural nodule in the right lower lobe which measures 1.8 x 1.2 cm on image 105/7. This nodule is new compared with the remote abdominal CT and has a morphology concerning for  adenocarcinoma. Focal infection/inflammation considered less likely. No other suspicious pulmonary nodules. Musculoskeletal/Chest wall: The bones are demineralized. There are mild compression deformities at T6 and T7, likely present on 11/04/2021 radiographs. No definite acute osseous findings. No suspicious chest wall findings. Review of the MIP images confirms the above findings. CTA ABDOMEN AND PELVIS FINDINGS VASCULAR Aorta: Mild atherosclerosis. No evidence of aneurysm, dissection or significant stenosis. Celiac: Patent without evidence of aneurysm, dissection or significant stenosis. SMA: Patent without evidence of aneurysm, dissection or significant stenosis. Renals: Both renal arteries are patent without evidence of aneurysm, dissection or significant stenosis. IMA: Patent without evidence of aneurysm, dissection, vasculitis or significant stenosis. Inflow: Patent without evidence of aneurysm, dissection, vasculitis or significant stenosis. Veins: No obvious venous abnormality within the limitations of this arterial phase study. Review of the MIP images confirms the above findings. NON-VASCULAR FINDINGS Hepatobiliary: The liver is normal in density without suspicious focal abnormality. No evidence of gallstones, gallbladder wall thickening or biliary dilatation. Pancreas: Unremarkable. No pancreatic ductal dilatation or surrounding inflammatory changes. Spleen: Normal in size without focal abnormality. Adrenals/Urinary Tract: Both adrenal glands appear normal. The kidneys appear normal  without evidence of urinary tract calculus, suspicious lesion or hydronephrosis. The bladder appears normal for its degree of distention. Stomach/Bowel: No enteric contrast administered. The stomach appears unremarkable for its degree of distension. No evidence of bowel wall thickening, distention or surrounding inflammatory change. Status post appendectomy. Diverticular changes within the sigmoid colon without surrounding acute inflammation. Lymphatic: There are no enlarged abdominal or pelvic lymph nodes. Reproductive: The prostate gland and seminal vesicles appear unremarkable. Other: Asymmetric fat in the right inguinal canal. Tiny umbilical hernia containing only fat. No herniated bowel, ascites or pneumoperitoneum. Musculoskeletal: No acute or significant osseous findings. Stable chronic L1 compression deformity. Review of the MIP images confirms the above findings. IMPRESSION: 1. No acute vascular findings in the chest, abdomen or pelvis. No evidence of aortic aneurysm or dissection. 2. Part solid subpleural nodule in the right lower lobe measuring up to 1.8 cm in diameter, new from remote abdominal CT and morphologically concerning for adenocarcinoma. Focal infection/inflammation considered less likely. Recommend referral to multi disciplinary thoracic oncology clinic. Consider one of the following in 3 months for both low-risk and high-risk individuals: (a) repeat chest CT, (b) follow-up PET-CT, or (c) tissue sampling. This recommendation follows the consensus statement: Guidelines for Management of Incidental Pulmonary Nodules Detected on CT Images: From the Fleischner Society 2017; Radiology 2017; 284:228-243. 3. Sigmoid diverticulosis without evidence of acute inflammation. 4. Grossly stable chronic thoracolumbar compression deformities. 5.  Aortic Atherosclerosis (ICD10-I70.0). Electronically Signed   By: Elsie Perone M.D.   On: 11/23/2023 14:25   DG Chest Port 1 View Result Date:  11/23/2023 CLINICAL DATA:  Chest pain. EXAM: PORTABLE CHEST 1 VIEW COMPARISON:  November 04, 2021 FINDINGS: The heart size and mediastinal contours are within normal limits. No acute infiltrate, pleural effusion or pneumothorax is identified. The visualized skeletal structures are unremarkable. IMPRESSION: No active disease. Electronically Signed   By: Suzen Dials M.D.   On: 11/23/2023 11:35   EKG: Independently reviewed.  Sinus rhythm rate 80, QTc 476.  No significant change from prior.  Assessment/Plan Principal Problem:   Chest pain Active Problems:   Hypertensive urgency   Pulmonary nodule   COPD (chronic obstructive pulmonary disease) (HCC)   HTN (hypertension)   DM (diabetes mellitus) (HCC)  Assessment and Plan: * Chest pain In the setting of hypertensive urgency with blood pressure of 236/107.  CTA and pelvis negative for dissection, or aortic aneurysm.  Troponin < 15 x 2, EKG without acute changes.  At this time no objective evidence of ischemia. -Evaluated by cardiology, obtain echo, regularly bring down blood pressure. -Resume aspirin , Crestor   Hypertensive urgency-blood pressure up to 236/107.  Reports blood pressure usually controlled at baseline, ran out of blood pressure medications, he is unsure of the names. -Awaiting med reconciliation-Norvasc 5, lisinopril 20, HCTZ 25, and carvedilol  6.25 twice daily on med list. - IV hydralazine 10 mg for systolic greater than 180 - Norvasc resumed at increased dose - 10 mg daily - Resume lisinopril 20 mg daily  Pulmonary nodule-incidental CT finding-  part solid subpleural nodule in the right lower lobe measuring up to 1.8 cm in diameter, new from remote abdominal CT and  morphologically concerning for adenocarcinoma. Focal infection/inflammation considered less likely. Recommend referral to multi disciplinary thoracic oncology clinic. Consider one of the following in 3 months for both low-risk and high-risk individuals: (a)  repeat chest CT, (b) follow-up PET-CT, or (c) tissue sampling. - Follow-up as outpatient   COPD- stable.  Chronic unchanged cough. - Resume home regimen  Diabetes mellitus - SSI- M - Resume Toujeo  at reduced dose 45 units daily(PTA dose 85 units) - Hold Jardiance, metformin , Actos   Tobacco use - Consult to quit smoking - Declined nicotine  patch  Chronic pain-on narcotics and Xanax  - resume  DVT prophylaxis: Lovenox  Code Status: FULL Family Communication: Son- Eva at bedside.  Disposition Plan: ~ 2 days Consults called: CArdiology Admission status: Inpt Tele I certify that at the point of admission it is my clinical judgment that the patient will require inpatient hospital care spanning beyond 2 midnights from the point of admission due to high intensity of service, high risk for further deterioration and high frequency of surveillance required.   Author: Tully FORBES Carwin, MD 11/23/2023 5:19 PM  For on call review [email or web address].

## 2023-11-23 NOTE — ED Provider Notes (Signed)
 [place] [facility] AT [facility] Provider Note   CSN: [id number] Arrival date & time: 11/23/23  1030     Patient presents with: Chest Pain   Cody [name] is a 73 y.o. male.  He is presenting with substernal chest pain pressure associated with diaphoresis that started about an hour ago.  He said he was working in the barn but it was in excess of work.  No significant shortness of breath.  Pain does not radiate.  He has chronic back pain though.  No fevers chills nausea vomiting.  Has tried nothing for his symptoms.  He said he has not taken his blood pressure medicine because it ran out last week.  Also takes a daily aspirin  but did not take 1 today.   The history is provided by the patient.  Chest Pain Pain location:  Substernal area Pain quality: aching and pressure   Pain radiates to:  Does not radiate Pain severity:  Moderate Onset quality:  Sudden Duration:  1 hour Timing:  Constant Progression:  Unchanged Chronicity:  New Relieved by:  None tried Associated symptoms: back pain and diaphoresis   Associated symptoms: no cough, no dizziness, no fever, no nausea, no shortness of breath and no vomiting   Risk factors: hypertension and smoking        Prior to Admission medications   Medication Sig Start Date End Date Taking? Authorizing Provider  albuterol  (PROAIR  HFA) 108 (90 Base) MCG/ACT inhaler Inhale 1-2 puffs into the lungs every 6 (six) hours as needed for wheezing or shortness of breath.    [provider]  ALPRAZolam  (XANAX ) 1 MG tablet Take 1 mg by mouth 3 (three) times daily.    [provider]  aspirin  EC 81 MG tablet Take 81 mg by mouth every morning.    [provider]  carvedilol  (COREG ) 6.25 MG tablet TAKE ONE TABLET (6.25MG  TOTAL) BY MOUTH TWO TIMES DAILY 07/20/17   Charls Pearla LABOR, MD  cephALEXin  (KEFLEX ) 500 MG capsule Take 1 capsule (500 mg total) by mouth 4 (four) times daily. 07/01/16   Geroldine Berg, MD   cephALEXin  (KEFLEX ) 500 MG capsule Take 1 capsule (500 mg total) by mouth 4 (four) times daily. 07/23/16   Brenna Lin, MD  gabapentin (NEURONTIN) 300 MG capsule Take 1 capsule by mouth 2 (two) times daily. 04/10/16   [provider]  hydrochlorothiazide  (HYDRODIURIL ) 25 MG tablet Take 1 tablet (25 mg total) by mouth daily. 01/08/16   Ladora Coy, MD  Insulin  Glargine (TOUJEO  SOLOSTAR) 300 UNIT/ML SOPN Inject 32 Units into the skin daily.    [provider]  JARDIANCE 10 MG TABS tablet Take 1 tablet by mouth daily. 04/22/16   [provider]  metFORMIN  (GLUCOPHAGE -XR) 500 MG 24 hr tablet Take 1,000 mg by mouth 2 (two) times daily.    [provider]  omeprazole (PRILOSEC) 20 MG capsule Take 20 mg by mouth daily.    [provider]  oxyCODONE -acetaminophen  (PERCOCET) 5-325 MG tablet Take 1-2 tablets by mouth every 6 (six) hours as needed. 07/11/16   Margrette Taft BRAVO, MD  pioglitazone  (ACTOS ) 15 MG tablet Take 15 mg by mouth daily.    [provider]  rosuvastatin  (CRESTOR ) 5 MG tablet Take 2.5 mg by mouth daily.  02/15/16   [provider]  sodium chloride  (OCEAN) 0.65 % SOLN nasal spray Place 2 sprays into both nostrils as needed for congestion.    [provider]  venlafaxine   XR (EFFEXOR -XR) 150 MG 24 hr capsule Take 150 mg by mouth daily with breakfast.    [provider]    Allergies: Penicillins    Review of Systems  Constitutional:  Positive for diaphoresis. Negative for fever.  Respiratory:  Negative for cough and shortness of breath.   Cardiovascular:  Positive for chest pain.  Gastrointestinal:  Negative for nausea and vomiting.  Musculoskeletal:  Positive for back pain.  Neurological:  Negative for dizziness.    Updated Vital Signs BP (!) 192/75 (BP Location: Left Arm)   Pulse 61   Temp 97.8 F (36.6 C) (Oral)   Resp 18   Ht 5' 8 (1.727 m)   Wt 84.8 kg   SpO2 98%   BMI 28.43 kg/m   Physical  Exam Vitals and nursing note reviewed.  Constitutional:      Appearance: He is well-developed.  HENT:     Head: Normocephalic and atraumatic.  Eyes:     Conjunctiva/sclera: Conjunctivae normal.  Cardiovascular:     Rate and Rhythm: Normal rate and regular rhythm.     Heart sounds: Normal heart sounds. No murmur heard. Pulmonary:     Effort: Pulmonary effort is normal. No respiratory distress.     Breath sounds: Normal breath sounds.  Abdominal:     Palpations: Abdomen is soft.     Tenderness: There is no abdominal tenderness.  Musculoskeletal:     Cervical back: Neck supple.     Right lower leg: No tenderness. No edema.     Left lower leg: No tenderness. No edema.  Skin:    General: Skin is warm and dry.  Neurological:     General: No focal deficit present.     Mental Status: He is alert.     GCS: GCS eye subscore is 4. GCS verbal subscore is 5. GCS motor subscore is 6.     (all labs ordered are listed, but only abnormal results are displayed) Labs Reviewed  BASIC METABOLIC PANEL WITH GFR - Abnormal; Notable for the following components:      Result Value   Potassium 3.2 (*)    All other components within normal limits  CBC  PRO BRAIN NATRIURETIC PEPTIDE  URINE DRUG SCREEN  HEMOGLOBIN A1C  LIPID PANEL  MAGNESIUM   TROPONIN T, HIGH SENSITIVITY  TROPONIN T, HIGH SENSITIVITY    EKG: EKG Interpretation Date/Time:  Monday November 23 2023 10:40:27 EDT Ventricular Rate:  80 PR Interval:  202 QRS Duration:  123 QT Interval:  412 QTC Calculation: 476 R Axis:   -61  Text Interpretation: Sinus rhythm LVH with IVCD, LAD and secondary repol abnrm Borderline prolonged QT interval No significant change since prior 5/18 Confirmed by Towana Sharper [phone]) on 11/23/2023 10:44:52 AM  Radiology: CT Angio Chest/Abd/Pel for Dissection W and/or W/WO Result Date: 11/23/2023 CLINICAL DATA:  Chest pain today. Acute aortic syndrome suspected. Patient reports running out of blood  pressure medications last week. EXAM: CT ANGIOGRAPHY CHEST, ABDOMEN AND PELVIS TECHNIQUE: Non-contrast CT of the chest was initially obtained. Multidetector CT imaging through the chest, abdomen and pelvis was performed using the standard protocol during bolus administration of intravenous contrast. Multiplanar reconstructed images and MIPs were obtained and reviewed to evaluate the vascular anatomy. RADIATION DOSE REDUCTION: This exam was performed according to the departmental dose-optimization program which includes automated exposure control, adjustment of the mA and/or kV according to patient size and/or use of iterative reconstruction technique. CONTRAST:  100mL OMNIPAQUE IOHEXOL 350 MG/ML SOLN COMPARISON:  Chest radiographs 11/23/2023 and 11/04/2021. Abdominopelvic CT 08/14/2006. FINDINGS: Cardiovascular: No acute vascular findings are demonstrated. There is atherosclerosis of the aorta, great vessels and coronary arteries. No evidence of acute pulmonary embolism. No evidence of thoracic aortic aneurysm or dissection. The heart size is normal. There is no pericardial effusion. Mediastinum/Nodes: There are no enlarged mediastinal, hilar or axillary lymph nodes. The thyroid  gland, trachea and esophagus demonstrate no significant findings. Lungs/Pleura: No pleural effusion or pneumothorax. There is a part solid subpleural nodule in the right lower lobe which measures 1.8 x 1.2 cm on image 105/7. This nodule is new compared with the remote abdominal CT and has a morphology concerning for adenocarcinoma. Focal infection/inflammation considered less likely. No other suspicious pulmonary nodules. Musculoskeletal/Chest wall: The bones are demineralized. There are mild compression deformities at T6 and T7, likely present on 11/04/2021 radiographs. No definite acute osseous findings. No suspicious chest wall findings. Review of the MIP images confirms the above findings. CTA ABDOMEN AND PELVIS FINDINGS VASCULAR Aorta:  Mild atherosclerosis. No evidence of aneurysm, dissection or significant stenosis. Celiac: Patent without evidence of aneurysm, dissection or significant stenosis. SMA: Patent without evidence of aneurysm, dissection or significant stenosis. Renals: Both renal arteries are patent without evidence of aneurysm, dissection or significant stenosis. IMA: Patent without evidence of aneurysm, dissection, vasculitis or significant stenosis. Inflow: Patent without evidence of aneurysm, dissection, vasculitis or significant stenosis. Veins: No obvious venous abnormality within the limitations of this arterial phase study. Review of the MIP images confirms the above findings. NON-VASCULAR FINDINGS Hepatobiliary: The liver is normal in density without suspicious focal abnormality. No evidence of gallstones, gallbladder wall thickening or biliary dilatation. Pancreas: Unremarkable. No pancreatic ductal dilatation or surrounding inflammatory changes. Spleen: Normal in size without focal abnormality. Adrenals/Urinary Tract: Both adrenal glands appear normal. The kidneys appear normal without evidence of urinary tract calculus, suspicious lesion or hydronephrosis. The bladder appears normal for its degree of distention. Stomach/Bowel: No enteric contrast administered. The stomach appears unremarkable for its degree of distension. No evidence of bowel wall thickening, distention or surrounding inflammatory change. Status post appendectomy. Diverticular changes within the sigmoid colon without surrounding acute inflammation. Lymphatic: There are no enlarged abdominal or pelvic lymph nodes. Reproductive: The prostate gland and seminal vesicles appear unremarkable. Other: Asymmetric fat in the right inguinal canal. Tiny umbilical hernia containing only fat. No herniated bowel, ascites or pneumoperitoneum. Musculoskeletal: No acute or significant osseous findings. Stable chronic L1 compression deformity. Review of the MIP images  confirms the above findings. IMPRESSION: 1. No acute vascular findings in the chest, abdomen or pelvis. No evidence of aortic aneurysm or dissection. 2. Part solid subpleural nodule in the right lower lobe measuring up to 1.8 cm in diameter, new from remote abdominal CT and morphologically concerning for adenocarcinoma. Focal infection/inflammation considered less likely. Recommend referral to multi disciplinary thoracic oncology clinic. Consider one of the following in 3 months for both low-risk and high-risk individuals: (a) repeat chest CT, (b) follow-up PET-CT, or (c) tissue sampling. This recommendation follows the consensus statement: Guidelines for Management of Incidental Pulmonary Nodules Detected on CT Images: From the Fleischner Society 2017; Radiology 2017; 284:228-243. 3. Sigmoid diverticulosis without evidence of acute inflammation. 4. Grossly stable chronic thoracolumbar compression deformities. 5.  Aortic Atherosclerosis (ICD10-I70.0). Electronically Signed   By: Elsie Perone M.D.   On: 11/23/2023 14:25   DG Chest Port 1 View Result Date: 11/23/2023 CLINICAL DATA:  Chest pain. EXAM: PORTABLE CHEST 1 VIEW COMPARISON:  November 04, 2021 FINDINGS: The  heart size and mediastinal contours are within normal limits. No acute infiltrate, pleural effusion or pneumothorax is identified. The visualized skeletal structures are unremarkable. IMPRESSION: No active disease. Electronically Signed   By: Suzen Dials M.D.   On: 11/23/2023 11:35     .Critical Care  Performed by: Towana Ozell BROCKS, MD Authorized by: Towana Ozell BROCKS, MD   Critical care provider statement:    Critical care time (minutes):  45   Critical care time was exclusive of:  Separately billable procedures and treating other patients   Critical care was necessary to treat or prevent imminent or life-threatening deterioration of the following conditions:  Circulatory failure and cardiac failure   Critical care was time  spent personally by me on the following activities:  Development of treatment plan with patient or surrogate, discussions with consultants, evaluation of patient's response to treatment, examination of patient, obtaining history from patient or surrogate, ordering and performing treatments and interventions, ordering and review of laboratory studies, ordering and review of radiographic studies, pulse oximetry, re-evaluation of patient's condition and review of old charts   I assumed direction of critical care for this patient from another provider in my specialty: no      Medications Ordered in the ED  nitroGLYCERIN  (NITROSTAT ) SL tablet 0.4 mg (0.4 mg Sublingual Given 11/23/23 1051)  amLODipine (NORVASC) tablet 10 mg (10 mg Oral Given 11/23/23 1638)  lisinopril (ZESTRIL) tablet 20 mg (20 mg Oral Given 11/23/23 1638)  potassium chloride  SA (KLOR-CON  M) CR tablet 40 mEq (40 mEq Oral Given 11/23/23 1638)  hydrALAZINE (APRESOLINE) injection 10 mg (has no administration in time range)  Influenza vac split trivalent PF (FLUZONE HIGH-DOSE) injection 0.5 mL (has no administration in time range)  aspirin  chewable tablet 324 mg (324 mg Oral Given 11/23/23 1051)  hydrALAZINE (APRESOLINE) injection 10 mg (10 mg Intravenous Given 11/23/23 1159)  morphine  (PF) 4 MG/ML injection 4 mg (4 mg Intravenous Given 11/23/23 1200)  acetaminophen  (TYLENOL ) tablet 650 mg (650 mg Oral Given 11/23/23 1201)  iohexol (OMNIPAQUE) 350 MG/ML injection 100 mL (100 mLs Intravenous Contrast Given 11/23/23 1301)  ALPRAZolam  (XANAX ) tablet 1 mg (1 mg Oral Given 11/23/23 1638)  oxyCODONE -acetaminophen  (PERCOCET) 7.5-325 MG per tablet 1 tablet (1 tablet Oral Given 11/23/23 1530)    Clinical Course as of 11/23/23 1716  Mon Nov 23, 2023  1118 Chest x-ray interpreted by me as no acute infiltrate.  Awaiting radiology reading. [MB]  1349 Patient states his chest discomfort is much improved.  Blood pressure remains high given hydralazine  IV.  CT angio ordered. [MB]  1432 Heart score is 6.  Will review with cardiology Dr. Alvan [MB]  [phone] Dr. Alvan felt it would not be unreasonable to admit the patient for blood pressure control and they would see in consult. [MB]  1450 Patient agreeable to admission but he wants to get his Xanax  and oxycodone  that he was was to have at 12.  He is also hungry and wants to eat something. [MB]    Clinical Course User Index [MB] Towana Ozell BROCKS, MD                                 Medical Decision Making Amount and/or Complexity of Data Reviewed Labs: ordered. Radiology: ordered.  Risk OTC drugs. Prescription drug management. Decision regarding hospitalization.   This patient complains of substernal chest pain; this involves an extensive number of treatment Options  and is a complaint that carries with it a high risk of complications and morbidity. The differential includes ACS, dissection, hypertensive emergency, vascular, pneumonia, PE  I ordered, reviewed and interpreted labs, which included CBC with normal white count normal chemistries other than mildly low potassium, flat troponins normal BNP I ordered medication aspirin  and nitro, IV hydralazine and reviewed PMP when indicated. I ordered imaging studies which included chest x-ray and CT angio chest abdomen and pelvis and I independently    visualized and interpreted imaging which showed no evidence of aortic dissection.  Incidental nodule. Additional history obtained from patient's companion Previous records obtained and reviewed in epic including prior cardiology notes I consulted cardiology Dr. Alvan and [facility] hospitalist Dr. Pearlean I and discussed lab and imaging findings and discussed disposition.  Cardiac monitoring reviewed, sinus rhythm Social determinants considered, tobacco use Critical Interventions: Initiation of IV medication for patient's hypertensive emergency in the setting of chest pain  After the  interventions stated above, I reevaluated the patient and found patient to be resting much more comfortably and blood pressure trending down although still elevated Admission and further testing considered, patient will benefit from mission the hospital for further blood pressure control and cardiac workup.  Patient in agreement with plan for admission.      Final diagnoses:  Hypertensive urgency  Acute chest pain    ED Discharge Orders     None          Towana Ozell BROCKS, MD 11/23/23 1719

## 2023-11-24 ENCOUNTER — Inpatient Hospital Stay (HOSPITAL_COMMUNITY)

## 2023-11-24 ENCOUNTER — Other Ambulatory Visit (HOSPITAL_COMMUNITY): Payer: Self-pay | Admitting: *Deleted

## 2023-11-24 DIAGNOSIS — R079 Chest pain, unspecified: Secondary | ICD-10-CM

## 2023-11-24 DIAGNOSIS — R0789 Other chest pain: Secondary | ICD-10-CM

## 2023-11-24 LAB — LIPID PANEL
Cholesterol: 71 mg/dL (ref 0–200)
HDL: 31 mg/dL — ABNORMAL LOW (ref >40)
LDL Cholesterol: 17 mg/dL (ref 0–99)
Total CHOL/HDL Ratio: 2.3 ratio
Triglycerides: 116 mg/dL (ref <150)
VLDL: 23 mg/dL (ref 0–40)

## 2023-11-24 LAB — ECHOCARDIOGRAM COMPLETE
AR max vel: 2.11 cm2
AV Area VTI: 1.97 cm2
AV Area mean vel: 2.07 cm2
AV Mean grad: 4.6 mmHg
AV Peak grad: 8.5 mmHg
Ao pk vel: 1.46 m/s
Area-P 1/2: 2.48 cm2
Height: 68 in
S' Lateral: 2.3 cm
Weight: 2991.2 [oz_av]

## 2023-11-24 LAB — BASIC METABOLIC PANEL WITH GFR
Anion gap: 8 (ref 5–15)
BUN: 9 mg/dL (ref 8–23)
CO2: 26 mmol/L (ref 22–32)
Calcium: 8.8 mg/dL — ABNORMAL LOW (ref 8.9–10.3)
Chloride: 105 mmol/L (ref 98–111)
Creatinine, Ser: 0.78 mg/dL (ref 0.61–1.24)
GFR, Estimated: >60 mL/min (ref >60)
Glucose, Bld: 66 mg/dL — ABNORMAL LOW (ref 70–99)
Potassium: 3.6 mmol/L (ref 3.5–5.1)
Sodium: 139 mmol/L (ref 135–145)

## 2023-11-24 LAB — MAGNESIUM: Magnesium: 2.2 mg/dL (ref 1.7–2.4)

## 2023-11-24 LAB — CBC
HCT: 44.1 % (ref 39.0–52.0)
Hemoglobin: 15.1 g/dL (ref 13.0–17.0)
MCH: 31.6 pg (ref 26.0–34.0)
MCHC: 34.2 g/dL (ref 30.0–36.0)
MCV: 92.3 fL (ref 80.0–100.0)
Platelets: 211 K/uL (ref 150–400)
RBC: 4.78 MIL/uL (ref 4.22–5.81)
RDW: 13.2 % (ref 11.5–15.5)
WBC: 6 K/uL (ref 4.0–10.5)
nRBC: 0 % (ref 0.0–0.2)

## 2023-11-24 LAB — HEMOGLOBIN A1C
Hgb A1c MFr Bld: 6.6 % — ABNORMAL HIGH (ref 4.8–5.6)
Mean Plasma Glucose: 142.72 mg/dL

## 2023-11-24 LAB — GLUCOSE, CAPILLARY
Glucose-Capillary: 79 mg/dL (ref 70–99)
Glucose-Capillary: 149 mg/dL — ABNORMAL HIGH (ref 70–99)

## 2023-11-24 MED ORDER — LISINOPRIL 40 MG PO TABS
40.0000 mg | ORAL_TABLET | Freq: Every day | ORAL | 0 refills | Status: AC
Start: 2023-11-24 — End: ?

## 2023-11-24 MED ORDER — AMLODIPINE BESYLATE 10 MG PO TABS: 10.0000 mg | ORAL_TABLET | Freq: Every day | ORAL | 0 refills | Status: AC

## 2023-11-24 MED ORDER — LISINOPRIL 10 MG PO TABS
40.0000 mg | ORAL_TABLET | Freq: Every day | ORAL | Status: DC
Start: 2023-11-24 — End: 2023-11-24
  Administered 2023-11-24: 40 mg via ORAL
  Filled 2023-11-24: qty 4

## 2023-11-24 MED ORDER — INSULIN GLARGINE 100 UNIT/ML ~~LOC~~ SOLN
40.0000 [IU] | Freq: Every morning | SUBCUTANEOUS | Status: DC
  Administered 2023-11-24: 40 [IU] via SUBCUTANEOUS
  Filled 2023-11-24 (×2): qty 0.4

## 2023-11-24 NOTE — Progress Notes (Signed)
*  PRELIMINARY RESULTS* Echocardiogram 2D Echocardiogram has been performed.  Cody [name] 11/24/2023, 11:35 AM

## 2023-11-24 NOTE — Progress Notes (Signed)
 Rounding Note   Patient Name: Cody [name] Date of Encounter: 11/24/2023  [facility] Cardiologist: Debera  Subjective No symptoms  Scheduled Meds:  amLODipine  10 mg Oral Daily   aspirin  EC  81 mg Oral q morning   enoxaparin  (LOVENOX ) injection  40 mg Subcutaneous Q24H   fluticasone  2 spray Each Nare Daily   Influenza vac split trivalent PF  0.5 mL Intramuscular Tomorrow-1000   insulin  aspart  0-15 Units Subcutaneous TID WC   insulin  aspart  0-5 Units Subcutaneous QHS   insulin  glargine  40 Units Subcutaneous q AM   lisinopril  20 mg Oral Daily   pantoprazole   40 mg Oral q morning   rosuvastatin   20 mg Oral Daily   umeclidinium-vilanterol  1 puff Inhalation Daily   Continuous Infusions:  PRN Meds: acetaminophen  **OR** acetaminophen , acetaminophen , albuterol , hydrALAZINE, nitroGLYCERIN , ondansetron  **OR** ondansetron  (ZOFRAN ) IV, oxyCODONE -acetaminophen , polyethylene glycol   Vital Signs  Vitals:   11/23/23 2015 11/24/23 0009 11/24/23 0353 11/24/23 0759  BP: (!) 189/74 (!) 169/96 (!) 163/81   Pulse: 60 65 60   Resp: 17 18 18    Temp: 97.6 F (36.4 C) 98.3 F (36.8 C) 97.8 F (36.6 C)   TempSrc: Oral Oral Oral   SpO2: 95% 95% 95% 90%  Weight:      Height:       No intake or output data in the 24 hours ending 11/24/23 0834    11/23/2023   10:43 AM 03/31/2023    2:33 PM 12/31/2022   10:46 AM  Last 3 Weights  Weight (lbs) 186 lb 15.2 oz 187 lb 186 lb  Weight (kg) 84.8 kg 84.823 kg 84.369 kg      Telemetry NSR - Personally Reviewed  ECG  N/a - Personally Reviewed  Physical Exam  GEN: No acute distress.   Neck: No JVD Cardiac: RRR, no murmurs, rubs, or gallops.  Respiratory: Clear to auscultation bilaterally. GI: Soft, nontender, non-distended  MS: No edema; No deformity. Neuro:  Nonfocal  Psych: Normal affect   Labs High Sensitivity Troponin:  No results for input(s): TROPONINIHS in the last 720 hours.   Chemistry Recent Labs  Lab  11/23/23 1043 11/24/23 0449  NA 141 139  K 3.2* 3.6  CL 105 105  CO2 26 26  GLUCOSE 96 66*  BUN 11 9  CREATININE 0.81 0.78  CALCIUM  8.9 8.8*  MG  --  2.2  GFRNONAA >60 >60  ANIONGAP 10 8    Lipids  Recent Labs  Lab 11/24/23 0449  CHOL 71  TRIG 116  HDL 31*  LDLCALC 17  CHOLHDL 2.3    Hematology Recent Labs  Lab 11/23/23 1043 11/24/23 0449  WBC 5.0 6.0  RBC 5.04 4.78  HGB 15.8 15.1  HCT 46.0 44.1  MCV 91.3 92.3  MCH 31.3 31.6  MCHC 34.3 34.2  RDW 13.1 13.2  PLT 199 211   Thyroid  No results for input(s): TSH, FREET4 in the last 168 hours.  BNP Recent Labs  Lab 11/23/23 1043  PROBNP 64.7    DDimer No results for input(s): DDIMER in the last 168 hours.   Radiology  CT Angio Chest/Abd/Pel for Dissection W and/or W/WO Result Date: 11/23/2023 CLINICAL DATA:  Chest pain today. Acute aortic syndrome suspected. Patient reports running out of blood pressure medications last week. EXAM: CT ANGIOGRAPHY CHEST, ABDOMEN AND PELVIS TECHNIQUE: Non-contrast CT of the chest was initially obtained. Multidetector CT imaging through the chest, abdomen and pelvis was  performed using the standard protocol during bolus administration of intravenous contrast. Multiplanar reconstructed images and MIPs were obtained and reviewed to evaluate the vascular anatomy. RADIATION DOSE REDUCTION: This exam was performed according to the departmental dose-optimization program which includes automated exposure control, adjustment of the mA and/or kV according to patient size and/or use of iterative reconstruction technique. CONTRAST:  OMNIPAQUE IOHEXOL 350 MG/ML SOLN COMPARISON:  Chest radiographs 11/23/2023 and 11/04/2021. Abdominopelvic CT 08/14/2006. FINDINGS: Cardiovascular: No acute vascular findings are demonstrated. There is atherosclerosis of the aorta, great vessels and coronary arteries. No evidence of acute pulmonary embolism. No evidence of thoracic aortic aneurysm or dissection.  The heart size is normal. There is no pericardial effusion. Mediastinum/Nodes: There are no enlarged mediastinal, hilar or axillary lymph nodes. The thyroid  gland, trachea and esophagus demonstrate no significant findings. Lungs/Pleura: No pleural effusion or pneumothorax. There is a part solid subpleural nodule in the right lower lobe which measures 1.8 x 1.2 cm on image 105/7. This nodule is new compared with the remote abdominal CT and has a morphology concerning for adenocarcinoma. Focal infection/inflammation considered less likely. No other suspicious pulmonary nodules. Musculoskeletal/Chest wall: The bones are demineralized. There are mild compression deformities at T6 and T7, likely present on 11/04/2021 radiographs. No definite acute osseous findings. No suspicious chest wall findings. Review of the MIP images confirms the above findings. CTA ABDOMEN AND PELVIS FINDINGS VASCULAR Aorta: Mild atherosclerosis. No evidence of aneurysm, dissection or significant stenosis. Celiac: Patent without evidence of aneurysm, dissection or significant stenosis. SMA: Patent without evidence of aneurysm, dissection or significant stenosis. Renals: Both renal arteries are patent without evidence of aneurysm, dissection or significant stenosis. IMA: Patent without evidence of aneurysm, dissection, vasculitis or significant stenosis. Inflow: Patent without evidence of aneurysm, dissection, vasculitis or significant stenosis. Veins: No obvious venous abnormality within the limitations of this arterial phase study. Review of the MIP images confirms the above findings. NON-VASCULAR FINDINGS Hepatobiliary: The liver is normal in density without suspicious focal abnormality. No evidence of gallstones, gallbladder wall thickening or biliary dilatation. Pancreas: Unremarkable. No pancreatic ductal dilatation or surrounding inflammatory changes. Spleen: Normal in size without focal abnormality. Adrenals/Urinary Tract: Both adrenal  glands appear normal. The kidneys appear normal without evidence of urinary tract calculus, suspicious lesion or hydronephrosis. The bladder appears normal for its degree of distention. Stomach/Bowel: No enteric contrast administered. The stomach appears unremarkable for its degree of distension. No evidence of bowel wall thickening, distention or surrounding inflammatory change. Status post appendectomy. Diverticular changes within the sigmoid colon without surrounding acute inflammation. Lymphatic: There are no enlarged abdominal or pelvic lymph nodes. Reproductive: The prostate gland and seminal vesicles appear unremarkable. Other: Asymmetric fat in the right inguinal canal. Tiny umbilical hernia containing only fat. No herniated bowel, ascites or pneumoperitoneum. Musculoskeletal: No acute or significant osseous findings. Stable chronic L1 compression deformity. Review of the MIP images confirms the above findings. IMPRESSION: 1. No acute vascular findings in the chest, abdomen or pelvis. No evidence of aortic aneurysm or dissection. 2. Part solid subpleural nodule in the right lower lobe measuring up to 1.8 cm in diameter, new from remote abdominal CT and morphologically concerning for adenocarcinoma. Focal infection/inflammation considered less likely. Recommend referral to multi disciplinary thoracic oncology clinic. Consider one of the following in 3 months for both low-risk and high-risk individuals: (a) repeat chest CT, (b) follow-up PET-CT, or (c) tissue sampling. This recommendation follows the consensus statement: Guidelines for Management of Incidental Pulmonary Nodules Detected on CT Images:  From the Fleischner Society 2017; Radiology 2017; 284:228-243. 3. Sigmoid diverticulosis without evidence of acute inflammation. 4. Grossly stable chronic thoracolumbar compression deformities. 5.  Aortic Atherosclerosis (ICD10-I70.0). Electronically Signed   By: Elsie Perone M.D.   On: 11/23/2023 14:25   DG  Chest Port 1 View Result Date: 11/23/2023 CLINICAL DATA:  Chest pain. EXAM: PORTABLE CHEST 1 VIEW COMPARISON:  November 04, 2021 FINDINGS: The heart size and mediastinal contours are within normal limits. No acute infiltrate, pleural effusion or pneumothorax is identified. The visualized skeletal structures are unremarkable. IMPRESSION: No active disease. Electronically Signed   By: Suzen Dials M.D.   On: 11/23/2023 11:35     Patient Profile   Cody [name] is a 73 y.o. male with a hx of HTN, prior mild LV systolic dysfunction that resolved, COPD, DM2 who is being seen 11/23/2023 for the evaluation of chest pain at the request of Dr Towana.   Assessment & Plan   1.Chest pain - suspect related to severe HTN on presentation, 236/107 - no objective evidence of ischemia by EKG or enzymes - CTA without acute aortic pathology - f/u echo results.  - starting back home regimen for bp and follow symptoms.  - no recurrent symptoms overnight   2. HTN - reports ran out of home bp meds last week - severe HTN on presentation 236/107 - started norvasc 10mg , lisinopril 20mg  daily. BP graduallly coming down, this AM 160/80. Avoiding rapid correctoin - increase lisinopril to 40mg  - would consider chlorhthalidone  as next agent if needed   3.Lung nodule - noted on CT scan, per primary team   4.Prior history of mild LV systolic dysfunction - seen in 2018 for transient low EF of 45% in setting of HTN emergency and COPD exacerbation - at f/u LVEF had normalized, likely transient stress induced dysfunction - f/u echo this admission.   F/u echo, if benign would be ok for outpatient f/u for further bp management.     For questions or updates, please contact [facility] Please consult [email or web address] for contact info under       Signed, Alvan Carrier, MD  11/24/2023, 8:34 AM

## 2023-11-24 NOTE — TOC CM/SW Note (Signed)
 Transition of Care [facility]) - Inpatient Brief Assessment   Patient Details  Name: Cody [name] MRN: [id number] Date of Birth: [birth year]  Transition of Care [facility]) CM/SW Contact:    Lucie Lunger, LCSWA Phone Number: 11/24/2023, 11:33 AM   Clinical Narrative: Transition of Care Department [facility]) has reviewed patient and no TOC needs have been identified at this time. We will continue to monitor patient advancement through interdiciplinary progression rounds. If new patient transition needs arise, please place a TOC consult.  Transition of Care Asessment: Insurance and Status: Insurance coverage has been reviewed Patient has primary care physician: Yes Home environment has been reviewed: From home Prior level of function:: Independent Prior/Current Home Services: No current home services Social Drivers of Health Review: SDOH reviewed no interventions necessary Readmission risk has been reviewed: Yes Transition of care needs: no transition of care needs at this time

## 2023-11-24 NOTE — Plan of Care (Signed)

## 2023-11-24 NOTE — Care Management Obs Status (Signed)
 MEDICARE OBSERVATION STATUS NOTIFICATION   Patient Details  Name: Cody [name] MRN: [id number] Date of Birth: 02-06-1951   Medicare Observation Status Notification Given:  Yes    Lucie Lunger, LCSWA 11/24/2023, 12:12 PM

## 2023-11-24 NOTE — Discharge Summary (Signed)
 Physician Discharge Summary   Patient: Cody [name] MRN: [id number] DOB: [birth year]  Admit date:     11/23/2023  Discharge date: 11/24/23  Discharge Physician: Bernardino KATHEE Come   PCP: Arloa Jarvis, NP   Recommendations at discharge:  Follow up with cardiology (will be arranged by clinic) for ongoing Tx.  Alternatively, could follow up with PCP in 1-2 weeks.  Note changes:  Norvasc increased 5mg  > 10mg  Lisinopril increased 20mg  > 40mg  Nifedipine stopped  Discharge Diagnoses: Principal Problem:   Chest pain Active Problems:   Hypertensive urgency   Pulmonary nodule   COPD (chronic obstructive pulmonary disease) (HCC)   HTN (hypertension)   DM (diabetes mellitus) [facility])  Hospital Course: HPI: Cody [name] is a 73 y.o. male with medical history significant for COPD and asthma, hypertension, diabetes mellitus. Patient presented to the ED with complaints of chest pressure with associated diaphoresis, suddenly feeling hot and started about an hour prior to arrival in the ED.  Patient was walking from his barn when this started.  At baseline, patient is very active, as he works on his farm-he reports expected aches and pains involving his shoulders but he does not believe he has had any chest pains with activity in the past before today. Patient reports he ran out of his blood pressure medications with first 1 run out about a week ago.  A couple other medications were going to run out, so he was waiting to just pick up all 3 prescriptions at the same time.  Normally his blood pressure is not elevated. No change in his breathing.  He has chronic unchanged cough.  No fevers no chills. Patient is currently chest pain-free.   ED Course: Temperature 97.6.  Heart rate 51-81.  Respiratory rate 15-19.  Blood pressure elevated to 236, improved with 10 mg of hydralazine. Troponin < 15 x 2 Potassium 3.2. EKG without acute changes. CTA negative for acute abnormality. Cardiology consulted, no objective  evidence of ischemia by EKG or enzymes, gradually bring down blood pressure, restart his home regimen.  Obtain echo. IV hydralazine 10 mg x 1 given.  Hospital course: Cardiology helped titrate medications with gradual improvement in BP and resolution of symptoms. Echo showed LVEF 60-65%, no RWMA, mild LVH with G1DD, and no major abnormalities otherwise. He is cleared for discharge by the cardiology and medical teams with plans for alterations in antihypertensives as detailed above. No other changes to chronic medications/conditions were made during admission.  Consultants: Cardiology, Dr. Alvan Procedures performed: Echo  Disposition: Home Diet recommendation:  Discharge Diet Orders (From admission, onward)     Start     Ordered   11/24/23 0000  Diet - low sodium heart healthy        11/24/23 1214           DISCHARGE MEDICATION: Allergies as of 11/24/2023       Reactions   Penicillins Other (See Comments)   Unknown         Medication List     STOP taking these medications    NIFEdipine 30 MG 24 hr tablet Commonly known as: ADALAT CC       TAKE these medications    ALPRAZolam  1 MG tablet Commonly known as: XANAX  Take 1 mg by mouth 3 (three) times daily.   amLODipine 10 MG tablet Commonly known as: NORVASC Take 1 tablet (10 mg total) by mouth daily. What changed:  medication strength how much to take when to take this  aspirin  EC 81 MG tablet Take 81 mg by mouth every morning.   ezetimibe 10 MG tablet Commonly known as: ZETIA Take 10 mg by mouth every morning.   fluticasone 50 MCG/ACT nasal spray Commonly known as: FLONASE Place 2 sprays into both nostrils daily.   Jardiance 25 MG Tabs tablet Generic drug: empagliflozin Take 25 mg by mouth every morning.   lisinopril 40 MG tablet Commonly known as: ZESTRIL Take 1 tablet (40 mg total) by mouth daily. What changed:  medication strength how much to take   metFORMIN  500 MG 24 hr  tablet Commonly known as: GLUCOPHAGE -XR Take 1,000 mg by mouth 2 (two) times daily.   omeprazole 20 MG capsule Commonly known as: PRILOSEC Take 20 mg by mouth daily.   oxyCODONE -acetaminophen  7.5-325 MG tablet Commonly known as: PERCOCET Take 1 tablet by mouth every 6 (six) hours as needed for moderate pain (pain score 4-6).   pioglitazone  15 MG tablet Commonly known as: ACTOS  Take 15 mg by mouth daily.   ProAir  HFA 108 (90 Base) MCG/ACT inhaler Generic drug: albuterol  Inhale 1-2 puffs into the lungs every 6 (six) hours as needed for wheezing or shortness of breath.   rosuvastatin  20 MG tablet Commonly known as: CRESTOR  Take 20 mg by mouth daily.   Toujeo  SoloStar 300 UNIT/ML Solostar Pen Generic drug: insulin  glargine (1 Unit Dial) Inject 75-85 Units into the skin in the morning.   venlafaxine  XR 75 MG 24 hr capsule Commonly known as: EFFEXOR -XR Take 225 mg by mouth every evening.        Follow-up Information     Arloa Jarvis, NP Follow up.   Specialty: Nurse Practitioner Contact information: [address] [place] [address] [phone]         Johnson Laymon HERO, PA-C Follow up on 12/11/2023.   Specialties: Cardiology, Cardiology Why: Cardiology Follow-up on 12/11/2023 at 2:30 PM. Contact information: [address] [place] [address] [phone]                Discharge Exam: Filed Weights   11/23/23 1043  Weight: 84.8 kg  BP (!) 186/89 (BP Location: Left Leg)   Pulse 70   Temp 97.7 F (36.5 C) (Oral)   Resp 16   Ht 5' 8 (1.727 m)   Wt 84.8 kg   SpO2 95%   BMI 28.43 kg/m   No distress Clear and nonlabored RRR, no MRG, no edema  Condition at discharge: stable  The results of significant diagnostics from this hospitalization (including imaging, microbiology, ancillary and laboratory) are listed below for reference.   Imaging Studies: ECHOCARDIOGRAM COMPLETE Result Date: 11/24/2023    ECHOCARDIOGRAM REPORT    Patient Name:   Cody [name] Matters Date of Exam: 11/24/2023 Medical Rec #:  [id number]   Height:       68.0 in Accession #:    [id number]  Weight:       186.9 lb Date of Birth:  [birth year]  BSA:          1.986 m Patient Age:    72 years    BP:           163/81 mmHg Patient Gender: M           HR:           60 bpm. Exam Location:  [facility] Procedure: 2D Echo, Cardiac Doppler and Color Doppler (Both Spectral and Color  Flow Doppler were utilized during procedure). Indications:    Chest Pain R07.9  History:        Patient has prior history of Echocardiogram examinations, most                 recent 10/02/2021. Cardiomyopathy, CAD and Previous Myocardial                 Infarction, COPD; Risk Factors:Hypertension and Diabetes.  Sonographer:    Aida Pizza RCS Referring Phys: [id number] DORN FALCON BRANCH IMPRESSIONS  1. Left ventricular ejection fraction, by estimation, is 60 to 65%. The left ventricle has normal function. The left ventricle has no regional wall motion abnormalities. There is mild left ventricular hypertrophy. Left ventricular diastolic parameters are consistent with Grade I diastolic dysfunction (impaired relaxation).  2. Right ventricular systolic function is normal. The right ventricular size is normal. Tricuspid regurgitation signal is inadequate for assessing PA pressure.  3. The mitral valve is normal in structure. No evidence of mitral valve regurgitation. No evidence of mitral stenosis.  4. The aortic valve is tricuspid. Aortic valve regurgitation is not visualized. No aortic stenosis is present.  5. The inferior vena cava is normal in size with greater than 50% respiratory variability, suggesting right atrial pressure of 3 mmHg. FINDINGS  Left Ventricle: Left ventricular ejection fraction, by estimation, is 60 to 65%. The left ventricle has normal function. The left ventricle has no regional wall motion abnormalities. The left ventricular internal cavity size was normal in size. There is   mild left ventricular hypertrophy. Left ventricular diastolic parameters are consistent with Grade I diastolic dysfunction (impaired relaxation). Normal left ventricular filling pressure. Right Ventricle: The right ventricular size is normal. Right vetricular wall thickness was not well visualized. Right ventricular systolic function is normal. Tricuspid regurgitation signal is inadequate for assessing PA pressure. Left Atrium: Left atrial size was normal in size. Right Atrium: Right atrial size was normal in size. Pericardium: There is no evidence of pericardial effusion. Mitral Valve: The mitral valve is normal in structure. No evidence of mitral valve regurgitation. No evidence of mitral valve stenosis. Tricuspid Valve: The tricuspid valve is normal in structure. Tricuspid valve regurgitation is trivial. No evidence of tricuspid stenosis. Aortic Valve: The aortic valve is tricuspid. Aortic valve regurgitation is not visualized. No aortic stenosis is present. Aortic valve mean gradient measures 4.6 mmHg. Aortic valve peak gradient measures 8.5 mmHg. Aortic valve area, by VTI measures 1.97 cm. Pulmonic Valve: The pulmonic valve was not well visualized. Pulmonic valve regurgitation is trivial. No evidence of pulmonic stenosis. Aorta: The aortic root is normal in size and structure and the ascending aorta was not well visualized. Venous: The inferior vena cava is normal in size with greater than 50% respiratory variability, suggesting right atrial pressure of 3 mmHg. IAS/Shunts: No atrial level shunt detected by color flow Doppler.  LEFT VENTRICLE PLAX 2D LVIDd:         4.60 cm   Diastology LVIDs:         2.30 cm   LV e' medial:    7.21 cm/s LV PW:         1.10 cm   LV E/e' medial:  10.5 LV IVS:        1.10 cm   LV e' lateral:   11.40 cm/s LVOT diam:     1.80 cm   LV E/e' lateral: 6.7 LV SV:         64 LV SV Index:  32 LVOT Area:     2.54 cm  RIGHT VENTRICLE RV S prime:     14.40 cm/s TAPSE (M-mode): 2.3 cm LEFT  ATRIUM             Index        RIGHT ATRIUM           Index LA diam:        3.30 cm 1.66 cm/m   RA Area:     17.30 cm LA Vol (A2C):   44.5 ml 22.41 ml/m  RA Volume:   47.30 ml  23.82 ml/m LA Vol (A4C):   33.5 ml 16.87 ml/m LA Biplane Vol: 38.7 ml 19.49 ml/m  AORTIC VALVE AV Area (Vmax):    2.11 cm AV Area (Vmean):   2.07 cm AV Area (VTI):     1.97 cm AV Vmax:           145.90 cm/s AV Vmean:          100.548 cm/s AV VTI:            0.322 m AV Peak Grad:      8.5 mmHg AV Mean Grad:      4.6 mmHg LVOT Vmax:         121.00 cm/s LVOT Vmean:        81.900 cm/s LVOT VTI:          0.250 m LVOT/AV VTI ratio: 0.78  AORTA Ao Root diam: 3.30 cm MITRAL VALVE MV Area (PHT): 2.48 cm    SHUNTS MV Decel Time: 306 msec    Systemic VTI:  0.25 m MV E velocity: 75.90 cm/s  Systemic Diam: 1.80 cm MV A velocity: 90.30 cm/s MV E/A ratio:  0.84 Dorn Ross MD Electronically signed by Dorn Ross MD Signature Date/Time: 11/24/2023/11:47:18 AM    Final    CT Angio Chest/Abd/Pel for Dissection W and/or W/WO Result Date: 11/23/2023 CLINICAL DATA:  Chest pain today. Acute aortic syndrome suspected. Patient reports running out of blood pressure medications last week. EXAM: CT ANGIOGRAPHY CHEST, ABDOMEN AND PELVIS TECHNIQUE: Non-contrast CT of the chest was initially obtained. Multidetector CT imaging through the chest, abdomen and pelvis was performed using the standard protocol during bolus administration of intravenous contrast. Multiplanar reconstructed images and MIPs were obtained and reviewed to evaluate the vascular anatomy. RADIATION DOSE REDUCTION: This exam was performed according to the departmental dose-optimization program which includes automated exposure control, adjustment of the mA and/or kV according to patient size and/or use of iterative reconstruction technique. CONTRAST:  OMNIPAQUE IOHEXOL 350 MG/ML SOLN COMPARISON:  Chest radiographs 11/23/2023 and 11/04/2021. Abdominopelvic CT 08/14/2006.  FINDINGS: Cardiovascular: No acute vascular findings are demonstrated. There is atherosclerosis of the aorta, great vessels and coronary arteries. No evidence of acute pulmonary embolism. No evidence of thoracic aortic aneurysm or dissection. The heart size is normal. There is no pericardial effusion. Mediastinum/Nodes: There are no enlarged mediastinal, hilar or axillary lymph nodes. The thyroid  gland, trachea and esophagus demonstrate no significant findings. Lungs/Pleura: No pleural effusion or pneumothorax. There is a part solid subpleural nodule in the right lower lobe which measures 1.8 x 1.2 cm on image 105/7. This nodule is new compared with the remote abdominal CT and has a morphology concerning for adenocarcinoma. Focal infection/inflammation considered less likely. No other suspicious pulmonary nodules. Musculoskeletal/Chest wall: The bones are demineralized. There are mild compression deformities at T6 and T7, likely present on 11/04/2021 radiographs. No definite acute osseous findings. No suspicious chest  wall findings. Review of the MIP images confirms the above findings. CTA ABDOMEN AND PELVIS FINDINGS VASCULAR Aorta: Mild atherosclerosis. No evidence of aneurysm, dissection or significant stenosis. Celiac: Patent without evidence of aneurysm, dissection or significant stenosis. SMA: Patent without evidence of aneurysm, dissection or significant stenosis. Renals: Both renal arteries are patent without evidence of aneurysm, dissection or significant stenosis. IMA: Patent without evidence of aneurysm, dissection, vasculitis or significant stenosis. Inflow: Patent without evidence of aneurysm, dissection, vasculitis or significant stenosis. Veins: No obvious venous abnormality within the limitations of this arterial phase study. Review of the MIP images confirms the above findings. NON-VASCULAR FINDINGS Hepatobiliary: The liver is normal in density without suspicious focal abnormality. No evidence of  gallstones, gallbladder wall thickening or biliary dilatation. Pancreas: Unremarkable. No pancreatic ductal dilatation or surrounding inflammatory changes. Spleen: Normal in size without focal abnormality. Adrenals/Urinary Tract: Both adrenal glands appear normal. The kidneys appear normal without evidence of urinary tract calculus, suspicious lesion or hydronephrosis. The bladder appears normal for its degree of distention. Stomach/Bowel: No enteric contrast administered. The stomach appears unremarkable for its degree of distension. No evidence of bowel wall thickening, distention or surrounding inflammatory change. Status post appendectomy. Diverticular changes within the sigmoid colon without surrounding acute inflammation. Lymphatic: There are no enlarged abdominal or pelvic lymph nodes. Reproductive: The prostate gland and seminal vesicles appear unremarkable. Other: Asymmetric fat in the right inguinal canal. Tiny umbilical hernia containing only fat. No herniated bowel, ascites or pneumoperitoneum. Musculoskeletal: No acute or significant osseous findings. Stable chronic L1 compression deformity. Review of the MIP images confirms the above findings. IMPRESSION: 1. No acute vascular findings in the chest, abdomen or pelvis. No evidence of aortic aneurysm or dissection. 2. Part solid subpleural nodule in the right lower lobe measuring up to 1.8 cm in diameter, new from remote abdominal CT and morphologically concerning for adenocarcinoma. Focal infection/inflammation considered less likely. Recommend referral to multi disciplinary thoracic oncology clinic. Consider one of the following in 3 months for both low-risk and high-risk individuals: (a) repeat chest CT, (b) follow-up PET-CT, or (c) tissue sampling. This recommendation follows the consensus statement: Guidelines for Management of Incidental Pulmonary Nodules Detected on CT Images: From the Fleischner Society 2017; Radiology 2017; 284:228-243. 3.  Sigmoid diverticulosis without evidence of acute inflammation. 4. Grossly stable chronic thoracolumbar compression deformities. 5.  Aortic Atherosclerosis (ICD10-I70.0). Electronically Signed   By: Elsie Perone M.D.   On: 11/23/2023 14:25   DG Chest Port 1 View Result Date: 11/23/2023 CLINICAL DATA:  Chest pain. EXAM: PORTABLE CHEST 1 VIEW COMPARISON:  November 04, 2021 FINDINGS: The heart size and mediastinal contours are within normal limits. No acute infiltrate, pleural effusion or pneumothorax is identified. The visualized skeletal structures are unremarkable. IMPRESSION: No active disease. Electronically Signed   By: Suzen Dials M.D.   On: 11/23/2023 11:35    Microbiology: Results for orders placed or performed during the hospital encounter of 07/03/16  Surgical pcr screen     Status: None   Collection Time: 07/03/16  3:49 PM   Specimen: Nasal Mucosa; Nasal Swab  Result Value Ref Range Status   MRSA, PCR NEGATIVE NEGATIVE Final   Staphylococcus aureus NEGATIVE NEGATIVE Final    Comment:        The Xpert SA Assay (FDA approved for NASAL specimens in patients over 10 years of age), is one component of a comprehensive surveillance program.  Test performance has been validated by [facility] for patients greater than or equal to  63 year old. It is not intended to diagnose infection nor to guide or monitor treatment.     Labs: CBC: Recent Labs  Lab 11/23/23 1043 11/24/23 0449  WBC 5.0 6.0  HGB 15.8 15.1  HCT 46.0 44.1  MCV 91.3 92.3  PLT 199 211   Basic Metabolic Panel: Recent Labs  Lab 11/23/23 1043 11/24/23 0449  NA 141 139  K 3.2* 3.6  CL 105 105  CO2 26 26  GLUCOSE 96 66*  BUN 11 9  CREATININE 0.81 0.78  CALCIUM  8.9 8.8*  MG  --  2.2   Liver Function Tests: No results for input(s): AST, ALT, ALKPHOS, BILITOT, PROT, ALBUMIN in the last 168 hours. CBG: Recent Labs  Lab 11/23/23 2259 11/24/23 0712 11/24/23 1109  GLUCAP 112* 79  149*    Discharge time spent: greater than 30 minutes.  Signed: Bernardino KATHEE Come, MD [facility] 11/24/2023

## 2023-11-24 NOTE — Care Management CC44 (Signed)
 Condition Code 44 Documentation Completed  Patient Details  Name: Cody [name] MRN: [id number] Date of Birth: 12/03/1950   Condition Code 44 given:  Yes Patient signature on Condition Code 44 notice:  Yes Documentation of 2 MD's agreement:  Yes Code 44 added to claim:  Yes    Lucie Lunger, LCSWA 11/24/2023, 12:12 PM

## 2023-12-11 ENCOUNTER — Ambulatory Visit: Attending: Student | Admitting: Student

## 2023-12-11 NOTE — Progress Notes (Deleted)
 Cardiology Office Note    Date:  12/11/2023  ID:  Cody [name], DOB 1950/03/22, MRN [id number] Cardiologist: None { :  History of Present Illness:    Cody [name] is a 73 y.o. 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  He was most recently admitted to [facility] earlier this month for evaluation of chest pain which occurred while walking from his barn. Was found to have hypertensive emergency while in the ED with BP at 236/107. EKG showed no acute ST changes and troponin values were negative. He had been out of blood pressure medications for over a week and it was felt that elevated BP was likely the cause of his chest pain. CTA on admission showed no evidence of aortic aneurysm or dissection. Was noted to have a solid subpleural nodule along the right lower lobe with follow-up imaging recommended in 3 months. Repeat echocardiogram was performed which showed a preserved EF of 60 to 65% with no regional wall motion abnormalities. He had grade 1 diastolic dysfunction, normal RV function and no significant valve abnormalities. He was discharged home on Amlodipine 10 mg daily, ASA 81 mg daily, Zetia 10 mg daily, Jardiance 25 mg daily, Lisinopril 40 mg daily and Crestor  20 mg daily.  Was recommended to consider the addition of Chlorthalidone if BP remained above goal.  - nodule clinic --> repeat CT  ROS: ***  Studies Reviewed:   EKG: EKG is*** ordered today and demonstrates ***   EKG Interpretation Date/Time:    Ventricular Rate:    PR Interval:    QRS Duration:    QT Interval:    QTC Calculation:   R Axis:      Text Interpretation:         Echocardiogram: 11/2023 IMPRESSIONS     1. Left ventricular ejection fraction, by estimation, is 60 to 65%. The  left ventricle has normal function. The left ventricle has no regional  wall motion abnormalities.  There is mild left ventricular hypertrophy.  Left ventricular diastolic parameters  are consistent with Grade I diastolic dysfunction (impaired relaxation).   2. Right ventricular systolic function is normal. The right ventricular  size is normal. Tricuspid regurgitation signal is inadequate for assessing  PA pressure.   3. The mitral valve is normal in structure. No evidence of mitral valve  regurgitation. No evidence of mitral stenosis.   4. The aortic valve is tricuspid. Aortic valve regurgitation is not  visualized. No aortic stenosis is present.   5. The inferior vena cava is normal in size with greater than 50%  respiratory variability, suggesting right atrial pressure of 3 mmHg.    Risk Assessment/Calculations:   {Does this patient have ATRIAL FIBRILLATION?[phone]} No BP recorded.  {Refresh Note OR Click here to enter BP  :1}***         Physical Exam:   VS:  There were no vitals taken for this visit.   Wt Readings from Last 3 Encounters:  11/23/23 186 lb 15.2 oz (84.8 kg)  03/31/23 187 lb (84.8 kg)  12/31/22 186 lb (84.4 kg)     GEN: Well nourished, well developed in no acute distress NECK: No JVD; No carotid bruits CARDIAC: ***RRR, no murmurs, rubs, gallops RESPIRATORY:  Clear to auscultation without rales, wheezing or rhonchi  ABDOMEN: Appears non-distended. No obvious abdominal masses. EXTREMITIES: No clubbing or  cyanosis. No edema.  Distal pedal pulses are 2+ bilaterally.   Assessment and Plan:   1. Essential hypertension - BP is at ***during today's visit.  2. Mixed hyperlipidemia - FLP during recent admission earlier this month showed total cholesterol 71, triglycerides 116, HDL 31 and LDL 17.  Continue current medical therapy with Crestor  20 mg daily and Zetia 10 mg daily.  3. Lung Nodule - CT during admission showed a partially solid subpleural nodule along the right lower lobe measuring up to 1.8 cm which was new from prior imaging and concerning for  adenocarcinoma. Was recommend to consider repeat Chest CT, follow-up PET/CT or tissue sampling in 3 months. ***  Signed, Cody CHRISTELLA Qua, PA-C
